# Patient Record
Sex: Female | Born: 1980 | Race: Black or African American | Hispanic: No | Marital: Married | Smoking: Former smoker
Health system: Southern US, Community
[De-identification: ages and names within clinical notes are randomized; demographics above are authoritative.]

## PROBLEM LIST (undated history)

## (undated) DIAGNOSIS — R87629 Unspecified abnormal cytological findings in specimens from vagina: Secondary | ICD-10-CM

## (undated) DIAGNOSIS — D649 Anemia, unspecified: Secondary | ICD-10-CM

## (undated) DIAGNOSIS — B9689 Other specified bacterial agents as the cause of diseases classified elsewhere: Secondary | ICD-10-CM

## (undated) DIAGNOSIS — A599 Trichomoniasis, unspecified: Secondary | ICD-10-CM

## (undated) DIAGNOSIS — N760 Acute vaginitis: Secondary | ICD-10-CM

## (undated) DIAGNOSIS — IMO0002 Reserved for concepts with insufficient information to code with codable children: Secondary | ICD-10-CM

## (undated) HISTORY — PX: LEEP: SHX91

## (undated) HISTORY — PX: WISDOM TOOTH EXTRACTION: SHX21

---
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
## 2003-12-17 ENCOUNTER — Other Ambulatory Visit: Admission: RE | Admit: 2003-12-17 | Discharge: 2003-12-17 | Payer: Self-pay | Admitting: Obstetrics and Gynecology

## 2004-03-27 ENCOUNTER — Inpatient Hospital Stay (HOSPITAL_COMMUNITY): Admission: AD | Admit: 2004-03-27 | Discharge: 2004-04-07 | Payer: Self-pay | Admitting: *Deleted

## 2004-04-10 ENCOUNTER — Ambulatory Visit (HOSPITAL_COMMUNITY): Admission: RE | Admit: 2004-04-10 | Discharge: 2004-04-10 | Payer: Self-pay | Admitting: Obstetrics and Gynecology

## 2004-05-07 ENCOUNTER — Inpatient Hospital Stay (HOSPITAL_COMMUNITY): Admission: AD | Admit: 2004-05-07 | Discharge: 2004-05-10 | Payer: Self-pay | Admitting: Obstetrics and Gynecology

## 2004-05-12 ENCOUNTER — Inpatient Hospital Stay (HOSPITAL_COMMUNITY): Admission: AD | Admit: 2004-05-12 | Discharge: 2004-05-14 | Payer: Self-pay | Admitting: Obstetrics and Gynecology

## 2004-06-03 ENCOUNTER — Inpatient Hospital Stay (HOSPITAL_COMMUNITY): Admission: AD | Admit: 2004-06-03 | Discharge: 2004-06-03 | Payer: Self-pay | Admitting: Obstetrics and Gynecology

## 2004-06-26 ENCOUNTER — Encounter (INDEPENDENT_AMBULATORY_CARE_PROVIDER_SITE_OTHER): Payer: Self-pay | Admitting: *Deleted

## 2004-06-26 ENCOUNTER — Inpatient Hospital Stay (HOSPITAL_COMMUNITY): Admission: AD | Admit: 2004-06-26 | Discharge: 2004-06-28 | Payer: Self-pay | Admitting: Obstetrics and Gynecology

## 2004-07-02 ENCOUNTER — Ambulatory Visit: Admission: RE | Admit: 2004-07-02 | Discharge: 2004-07-02 | Payer: Self-pay | Admitting: Obstetrics and Gynecology

## 2006-12-06 DIAGNOSIS — IMO0002 Reserved for concepts with insufficient information to code with codable children: Secondary | ICD-10-CM

## 2006-12-06 DIAGNOSIS — R87619 Unspecified abnormal cytological findings in specimens from cervix uteri: Secondary | ICD-10-CM

## 2006-12-06 HISTORY — DX: Reserved for concepts with insufficient information to code with codable children: IMO0002

## 2006-12-06 HISTORY — DX: Unspecified abnormal cytological findings in specimens from cervix uteri: R87.619

## 2008-07-12 ENCOUNTER — Ambulatory Visit: Payer: Self-pay | Admitting: Gynecology

## 2008-07-17 ENCOUNTER — Ambulatory Visit: Payer: Self-pay | Admitting: Obstetrics & Gynecology

## 2008-08-02 ENCOUNTER — Ambulatory Visit: Payer: Self-pay | Admitting: Obstetrics & Gynecology

## 2009-01-07 ENCOUNTER — Emergency Department (HOSPITAL_COMMUNITY): Admission: EM | Admit: 2009-01-07 | Discharge: 2009-01-07 | Payer: Self-pay | Admitting: Emergency Medicine

## 2009-08-25 ENCOUNTER — Ambulatory Visit (HOSPITAL_COMMUNITY): Admission: RE | Admit: 2009-08-25 | Discharge: 2009-08-25 | Payer: Self-pay | Admitting: Family Medicine

## 2009-09-23 ENCOUNTER — Emergency Department (HOSPITAL_COMMUNITY): Admission: EM | Admit: 2009-09-23 | Discharge: 2009-09-23 | Payer: Self-pay | Admitting: Emergency Medicine

## 2009-10-08 ENCOUNTER — Ambulatory Visit: Payer: Self-pay | Admitting: Obstetrics and Gynecology

## 2009-10-09 ENCOUNTER — Encounter: Payer: Self-pay | Admitting: Obstetrics and Gynecology

## 2009-10-09 LAB — CONVERTED CEMR LAB
Clue Cells Wet Prep HPF POC: NONE SEEN
Trich, Wet Prep: NONE SEEN
WBC, Wet Prep HPF POC: NONE SEEN
Yeast Wet Prep HPF POC: NONE SEEN

## 2009-11-12 ENCOUNTER — Emergency Department (HOSPITAL_COMMUNITY): Admission: EM | Admit: 2009-11-12 | Discharge: 2009-11-12 | Payer: Self-pay | Admitting: Family Medicine

## 2009-11-25 ENCOUNTER — Emergency Department (HOSPITAL_COMMUNITY): Admission: EM | Admit: 2009-11-25 | Discharge: 2009-11-25 | Payer: Self-pay | Admitting: Family Medicine

## 2010-02-02 ENCOUNTER — Emergency Department (HOSPITAL_COMMUNITY): Admission: EM | Admit: 2010-02-02 | Discharge: 2010-02-02 | Payer: Self-pay | Admitting: Family Medicine

## 2011-02-24 LAB — WET PREP, GENITAL
Trich, Wet Prep: NONE SEEN
Yeast Wet Prep HPF POC: NONE SEEN

## 2011-02-24 LAB — GC/CHLAMYDIA PROBE AMP, GENITAL: GC Probe Amp, Genital: NEGATIVE

## 2011-02-24 LAB — POCT URINALYSIS DIP (DEVICE)
Hgb urine dipstick: NEGATIVE
Ketones, ur: NEGATIVE mg/dL
Protein, ur: 30 mg/dL — AB
Specific Gravity, Urine: 1.015 (ref 1.005–1.030)

## 2011-02-24 LAB — POCT PREGNANCY, URINE: Preg Test, Ur: NEGATIVE

## 2011-02-27 ENCOUNTER — Inpatient Hospital Stay (HOSPITAL_COMMUNITY)
Admission: AD | Admit: 2011-02-27 | Discharge: 2011-02-27 | Disposition: A | Discharge status: Home / Self Care | Payer: Self-pay | Source: Ambulatory Visit | Attending: Obstetrics & Gynecology | Admitting: Obstetrics & Gynecology

## 2011-02-27 ENCOUNTER — Inpatient Hospital Stay (HOSPITAL_COMMUNITY): Payer: Self-pay

## 2011-02-27 DIAGNOSIS — R109 Unspecified abdominal pain: Secondary | ICD-10-CM | POA: Insufficient documentation

## 2011-02-27 LAB — URINALYSIS, ROUTINE W REFLEX MICROSCOPIC
Glucose, UA: NEGATIVE mg/dL
Ketones, ur: NEGATIVE mg/dL
Protein, ur: NEGATIVE mg/dL
Urobilinogen, UA: 0.2 mg/dL (ref 0.0–1.0)

## 2011-02-27 LAB — WET PREP, GENITAL
Trich, Wet Prep: NONE SEEN
Yeast Wet Prep HPF POC: NONE SEEN

## 2011-02-27 LAB — POCT PREGNANCY, URINE: Preg Test, Ur: NEGATIVE

## 2011-02-27 LAB — CBC
HCT: 36.9 % (ref 36.0–46.0)
Platelets: 188 10*3/uL (ref 150–400)
RBC: 5.17 MIL/uL — ABNORMAL HIGH (ref 3.87–5.11)
RDW: 14.7 % (ref 11.5–15.5)
WBC: 5 10*3/uL (ref 4.0–10.5)

## 2011-03-02 LAB — GC/CHLAMYDIA PROBE AMP, GENITAL: Chlamydia, DNA Probe: NEGATIVE

## 2011-03-08 LAB — POCT RAPID STREP A (OFFICE): Streptococcus, Group A Screen (Direct): POSITIVE — AB

## 2011-03-15 ENCOUNTER — Inpatient Hospital Stay (INDEPENDENT_AMBULATORY_CARE_PROVIDER_SITE_OTHER)
Admission: RE | Admit: 2011-03-15 | Discharge: 2011-03-15 | Disposition: A | Discharge status: Home / Self Care | Payer: Self-pay | Source: Ambulatory Visit | Attending: Family Medicine | Admitting: Family Medicine

## 2011-03-15 DIAGNOSIS — R6889 Other general symptoms and signs: Secondary | ICD-10-CM

## 2011-03-15 LAB — WET PREP, GENITAL
Clue Cells Wet Prep HPF POC: NONE SEEN
Trich, Wet Prep: NONE SEEN
Yeast Wet Prep HPF POC: NONE SEEN

## 2011-03-16 LAB — GC/CHLAMYDIA PROBE AMP, GENITAL: GC Probe Amp, Genital: NEGATIVE

## 2011-03-16 LAB — POCT URINALYSIS DIP (DEVICE)
Bilirubin Urine: NEGATIVE
Glucose, UA: NEGATIVE mg/dL
Hgb urine dipstick: NEGATIVE
Ketones, ur: NEGATIVE mg/dL
Specific Gravity, Urine: >=1.03 (ref 1.005–1.030)

## 2011-04-20 NOTE — Group Therapy Note (Signed)
NAMEAVARI, NEVARES NO.:  [id number]   MEDICAL RECORD NO.:  [id number]          PATIENT TYPE:  WOC   LOCATION:  [facility]                   FACILITY:  [facility]   PHYSICIAN:  Elsie Lincoln, MD      DATE OF BIRTH:  [birth year]   DATE OF SERVICE:                                  CLINIC NOTE   HISTORY:  The patient is a 30 year old female who presents for LEEP.  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  She has been consented for LEEP.  The patient understands the risks include, but are not limited to  bleeding, infection and incompetent cervix.  The patient finally removed  all of her piercings.  Her UPT is negative.  She does have IUD strings  present.   HOSPITAL COURSE:  The patient is placed in dorsal lithotomy position and  an insulated speculum was placed into the vagina.  The cervix was  brought into view, 10 cc of 2% lidocaine with 200,000 epinephrine was  injected in 4 quadrants.  The patient started feeling slightly  lightheaded.  Her pulse was 96 and she was not diaphoretic.  She agreed  to continue with the procedure given that her vital signs were stable.  The LEEP was performed using a Fischer medium-size electrode.  The edges  of the excision was roller balled with good hemostasis.  Monsel's was  placed in the cervix.  Post-procedure, the patient still felt  lightheaded and with blurry vision.  Her pulse is now 72.  However, she  does admit to not eating all day and it is now 4:15.  We are obtaining  crackers, peanut butter and juice.  We will check a CBG for  hypoglycemia.   FOLLOW UP:  The patient is to come back in 2 weeks for results.           ______________________________  Elsie Lincoln, MD     KL/MEDQ  D:  07/17/2008  T:  07/17/2008  Job:  [id number]

## 2011-04-20 NOTE — Group Therapy Note (Signed)
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  The  patient's Pap smear preceding said colposcopy demonstrated ASCUS with  positive HPV findings.  The patient has never had a prior history of Pap  abnormalities or treatments of her cervix.   The patient was advised about the benefits of a LEEP procedure and will  be scheduled for same.  She will see the video before she leaves the  office today and explanation as to expectations.  Post LEEP were  discussed with the patient.           ______________________________  Ginger Carne, MD     SHB/MEDQ  D:  07/12/2008  T:  07/12/2008  Job:  [id number]

## 2011-04-20 NOTE — Group Therapy Note (Signed)
NAMESHALIAH, WANN NO.:  [id number]   MEDICAL RECORD NO.:  [id number]          PATIENT TYPE:  WOC   [facility]ATION[facility]                   [facility]   PHYSICIAN:  Elsie Lincoln, MD      DATE OF BIRTH:  [birth year]   DATE OF SERVICE:  08/02/2008                                  CLINIC NOTE   The patient is a 30 year old female who presents for a LEEP result.  She  has CIN II-III on biopsy and the LEEP states that the pathology is  consistent with this.  Margins were negative.  There is also a benign  nabothian cyst.  The patient was complaining of some discharge on  sterile speculum exam.  There was no discharge noted and the cervix  appears well-healed.  The patient is not sexually active currently, but  does have an IUD in place.  I did not see the strings on our exam.  At  her next Pap smear we will look again, because during my dictation on  July 17, 2008, there were presence of strings.  The patient will  return in 4 months for Pap smear.           ______________________________  Elsie Lincoln, MD     KL/MEDQ  D:  08/02/2008  T:  08/02/2008  Job:  [id number]

## 2011-04-23 NOTE — H&P (Signed)
NAME:  Stacy [name], Stacy [name]                        ACCOUNT NO.:  [id number]   MEDICAL RECORD NO.:  [id number]                   PATIENT TYPE:  INP   LOCATION:  9154                                 [facility]   PHYSICIAN:  Maxie Better, M.D.            DATE OF BIRTH:  04-08-1981   DATE OF ADMISSION:  03/27/2004  DATE OF DISCHARGE:                                HISTORY & PHYSICAL   CHIEF COMPLAINT:  Oligohydramnios.   HISTORY OF PRESENT ILLNESS:  This is a 30 year old, gravida 1, para 0,  married, black female with an LMP of October 02, 2003, [facility] of July 09, 2004, which is consistent with ultrasound done on December 30, 2003, at 12  weeks and 2 days, who is now at 25 weeks and 2 days gestation.  Being  admitted for aggressive IV hydration secondary to oligohydramnios.  The  patient underwent an ultrasound today on March 27, 2004, that showed an  amniotic fluid index of 7.4, which is at the first percentile.  Her  biophysical profile was 8/8.  Dopplers are normal.  Her prenatal course has  been notable for a diagnosis of alpha thalassemia trait.  Her husband also  has alpha thalassemia trait.  She was counseled at the [facility].  In addition, the pregnancy has been complicated by a question of  intrauterine growth restriction by ultrasound done on March 18, 2004, at  which time the estimated fetal weight was 536 g, which was at the 6th  percentile.  Amniotic fluid was normal at that time.  The patient had a  study done to follow up an ultrasound done on February 19, 2004, at which time  she presented for anatomic fetal survey and was found to have an estimated  fetal weight of about 12th percentile.  The anatomic fetal survey was  otherwise unremarkable.  The patient had a consultative opinion with Dr.  Sherrie George at [facility] regarding the intrauterine growth  restriction diagnosis.  The patient was seen on March 20, 2004, at which  time she was felt to be 557  g, which was at the 24th percentile.  Recommendation was made at that time for repeat ultrasound in three weeks'  time to follow growth in that this may be a constitutionally small fetal or  fetal growth restriction evolving.  The patient had a first trimester  genetic screen that was normal.  AFP 3 test was normal.  She is a nonsmoker.  She has noted good fetal movements.  No vaginal bleeding.  She has had poor  weight gain to date, a total of 10 pounds.  She has no hypertension or  diabetes.  The patient denied any leakage of fluid or abnormal discharge.  Prenatal care was at [facility].  Primary obstetrician Maxie Better, M.D.   PRENATAL LABORATORIES:  Her blood type is O positive.  Antibody screen is  negative.  Hemoglobin electrophoresis consistent with alpha thalassemia  trait.  RPR was nonreactive.  Rubella was immune.  Hepatitis B surface  antigen was negative.  HIV test was negative.  GC and chlamydia were  negative.  Pap was within normal limits.  Ultrasound per the HPI.  The  patient received betamethasone on March 18, 2004, and March 19, 2004.   ALLERGIES:  No known drug allergies.   MEDICINES:  Prenatal vitamins.   PAST MEDICAL HISTORY:  Negative.   PAST SURGICAL HISTORY:  Negative.   FAMILY HISTORY:  Noncontributory.   SOCIAL HISTORY:  Married.  Nonsmoker at home.   REVIEW OF SYSTEMS:  Negative.   PHYSICAL EXAMINATION:  GENERAL APPEARANCE:  A petite, gravid, black female  in no acute distress.  VITAL SIGNS:  Blood pressure 100/60.  The fetal heart rate was 144.  WEIGHT:  128.6 pounds.  SKIN:  No lesions.  HEENT:  Anicteric sclerae.  Pink conjunctivae.  The oropharynx was negative.  HEART:  Regular rate and rhythm without murmur.  LUNGS:  Clear to auscultation.  BREASTS:  Soft and nontender.  No palpable mass.  ABDOMEN:  Gravid.  Fundal height of 24 cm.  PELVIC:  Deferred.  EXTREMITIES:  No edema.   IMPRESSION:  1. Oligohydramnios.  2.  Intrauterine gestation of 25-2/7 weeks.   PLAN:  1. Admission.  2. Aggressive IV hydration.  3. Repeat amniotic fluid index on Sunday.  4. Continuous fetal monitor to the best of the ability of that gestational     age.  5. Routine admission laboratories if indicated.                                               Maxie Better, M.D.    [place]/MEDQ  D:  03/27/2004  T:  03/27/2004  Job:  [id number]

## 2011-04-23 NOTE — H&P (Signed)
NAMERABAB, CURRINGTON                        ACCOUNT NO.:  [id number]   MEDICAL RECORD NO.:  [id number]                   PATIENT TYPE:  INP   LOCATION:  9176                                 [facility]   PHYSICIAN:  Maxie Better, M.D.            DATE OF BIRTH:  [birth year]   DATE OF ADMISSION:  05/07/2004  DATE OF DISCHARGE:                                HISTORY & PHYSICAL   CHIEF COMPLAINT:  Oligohydramnios.   HISTORY OF PRESENT ILLNESS:  This is a second admission for this 30-year-  old, gravida 1, para 0, married, black female, last menstrual period of  October 01, 2004, [facility] of July 09, 2004 who is currently at 79 1/[redacted] weeks  gestation being admitted for aggressive hydration secondary to  oligohydramnios. The patient underwent ultrasound on May 07, 2004 where an  amniotic fluid index was 8.0 which was less than the 5th percentile.  Estimated fetal weight was 3 pounds 5 ounces which is of the 11th  percentile; however, the abdominal circumference of the fetus was at the 4th  percentile, the BPD was 8.0 which was in the 71st percentile. Dopplers were  normal. The patient notes good fetal movement, she denies any leakage of  fluid or vaginal bleeding.  Her fern study was negative in the office. The  patient was previously admitted to [facility] on March 27, 2004  through Apr 07, 2004 for oligohydramnios as well.  With resolution of her  oligohydramnios at that time as well as the intrauterine growth restriction  which had been previously diagnosed.  Prenatal care is at [facility],  primary obstetrician Maxie Better, M.D.   PRENATAL LABS:  Blood type O positive, antibody screen is negative.  Hemoglobin electrophoresis is positive for alpha thalassemia trait. Father  of the baby is also apha thalassemia trait.  RPR is nonreactive, rubella is  immune.  Hepatitis B surface antigen is negative, HIV test was negative.  GC  and chlamydia cultures were negative.   Pap was within normal limits.  First  trimester genetic screening was normal, glucose challenge test was normal.  The patient had an ultrasound on Apr 14, 2004 at which time the amniotic  fluid index was 11.9, which was at the 21st percentile.  Biophysical profile  was 8/8.  The baby weighed 1047 gram at that time which was at the 18th  percentile.   PAST MEDICAL HISTORY:  No known drug allergies.   MEDICATIONS:  Prenatal vitamins, iron.   PAST MEDICAL HISTORY:  Alpha thalassemia trait.   PAST SURGICAL HISTORY:  Negative.   OBSTETRICAL HISTORY:  Present pregnancy.   FAMILY HISTORY:  Noncontributory.   SOCIAL HISTORY:  Married, nonsmoker at home.   REVIEW OF SYMPTOMS:  Negative except as the history of present illness.   PHYSICAL EXAMINATION:  GENERAL:  Well-developed, well-nourished, black  female, gravid in no acute distress.  VITAL SIGNS:  Blood pressure 84/52, weight  135 pounds, fetal heart rate was  137.  SKIN:  Shows no lesions.  HEENT:  Anicteric sclera.  Pail, pink conjunctiva.  Oropharynx negative.  HEART:  Regular rate and rhythm without murmur.  LUNGS:  Clear to auscultation.  BREASTS:  Soft, nontender without palpable mass.  BACK:  No CVA tenderness.  ABDOMEN:  Gravida, fundal height of 28 cm.  PELVIC:  Vulva showed no lesions, vagina has a white discharge.  __________  was negative. Crist Fat was negative. Cervix was long and closed, presenting part  was vertex, high.  EXTREMITIES:  No edema.   IMPRESSION:  Recurrent oligohydramnios, intrauterine gestation at 31-1/7th  weeks, early IAGI/STA alpha-thalassemia trait.   PLAN:  Admission, continuous fetal monitoring, aggressive IV fluid  hydration, repeat amniotic fluid index on Sunday and increase oral fluid  intake.                                               Maxie Better, M.D.    [place]/MEDQ  D:  05/07/2004  T:  05/07/2004  Job:  [id number]

## 2011-04-23 NOTE — H&P (Signed)
NAMEDEERICA, WASZAK                        ACCOUNT NO.:  [id number]   MEDICAL RECORD NO.:  [id number]                   PATIENT TYPE:  INP   LOCATION:  9160                                 [facility]   PHYSICIAN:  Richardean Sale, M.D.                DATE OF BIRTH:  Feb 13, 1981   DATE OF ADMISSION:  05/12/2004  DATE OF DISCHARGE:                                HISTORY & PHYSICAL   CHIEF COMPLAINT:  Oligohydramnios.   HISTORY OF PRESENT ILLNESS:  This is a 30 year old gravida 1 para 0 married  black female with a last menstrual period of October 02, 2003 and an [facility] of  July 09, 2004 who is now 72 and 6 days gestation.  The patient has been  admitted multiple times for IV hydration secondary to oligohydramnios.  The  patient was recently discharged from the hospital on May 10, 2004 after a 2-  day hospital stay for IV fluids.  Amniotic fluid index at that time was  normal.  She presented to the office today for follow-up ultrasound and the  amniotic fluid index is now only 8 which is less than the 5th percentile for  her gestational age.  Heart rate is 136.  Biophysical profile performed is  8/8 and umbilical artery Dopplers are normal.  The patient has been placed  on home bedrest but has been admitted multiple times for oligohydramnios.  On recent ultrasound for estimated fetal weight at 31 weeks weight was only  1503 g which is the 11th percentile for gestational age and there is  questionable asymmetric IUGR given lag of the abdominal circumference.  The  patient has had a normal first trimester screen and normal AFP test.  She is  a nonsmoker.  She has had poor weight gain throughout the pregnancy.  There  is no history of hypertension or diabetes.  Today the patient is without  complaint.  She denies any loss of fluid, vaginal bleedings, or  contractions, and reports good fetal movement.  Prenatal care is at [facility] with primary obstetrician Maxie Better,  M.D.   PRENATAL LABORATORY DATA:  Blood type is O positive, antibody screen  negative.  Hemoglobin electrophoresis consistent with alpha thalassemia  trait.  RPR nonreactive.  Rubella immune.  Hepatitis B surface antigen  negative.  HIV negative.  Gonorrhea and chlamydia negative.  Pap within  normal limits.  Recent ultrasound as above.  One-hour Glucola 105.  The  patient is status post betamethasone on April 13 and March 19, 2004.   ALLERGIES:  No known drug allergies.   MEDICATIONS:  Prenatal vitamins.   PAST MEDICAL HISTORY:  No prior hospitalizations with the exception of  during this pregnancy.   PAST SURGICAL HISTORY:  None.   SOCIAL HISTORY:  Married.  No tobacco, alcohol, or drugs.   OBSTETRICAL HISTORY:  Gravida 1 para 0.   GYNECOLOGICAL HISTORY:  Positive history  of chlamydia.  No abnormal Pap  smears.   FAMILY HISTORY:  Noncontributory.   PHYSICAL EXAMINATION:  GENERAL:  She is a thin, gravid black female in no  apparent distress.  VITAL SIGNS:  Blood pressure 104/58, weight 132, fetal heart tones 136 on  ultrasound with BPP of 8/8.  HEENT:  Within normal limits.  HEART:  Regular rate and rhythm.  LUNGS:  Clear.  ABDOMEN:  Gravid, soft, nontender.  PELVIC:  Deferred.  EXTREMITIES:  No edema.   ASSESSMENT:  Intrauterine pregnancy at 31 weeks 6 days gestation with  persistent oligohydramnios.   PLAN:  1. Will admit to antenatal.  2. Start IV fluid therapy.  3. Recommend repeat amniotic fluid index in 2 days.  4. Continuous fetal monitoring.                                               Richardean Sale, M.D.    JW/MEDQ  D:  05/12/2004  T:  05/12/2004  Job:  [id number]

## 2011-04-23 NOTE — Discharge Summary (Signed)
Stacy [name], Stacy [name]                        ACCOUNT NO.:  [id number]   MEDICAL RECORD NO.:  [id number]                   PATIENT TYPE:  INP   LOCATION:  9159                                 [facility]   PHYSICIAN:  Maxie Better, M.D.            DATE OF BIRTH:  Apr 01, 1981   DATE OF ADMISSION:  05/07/2004  DATE OF DISCHARGE:  05/10/2004                                 DISCHARGE SUMMARY   ADMISSION DIAGNOSES:  1. Recurrent oligohydramnios.  2. Intrauterine gestation at 47 and one-seventh weeks.  3. Early intrauterine growth restriction/small for gestational age.  4. Alpha thalassemia trait.   DISCHARGE DIAGNOSES:  1. Recurrent oligohydramnios, resolved.  2. Intrauterine gestation at 108 and four-sevenths weeks.  3. Alpha thalassemia trait.  4. Early intrauterine growth restriction/small-for-gestational- age baby.   HISTORY OF PRESENT ILLNESS:  This 30 year old gravida 1 para 0 female at 55  and one-seventh weeks gestation admitted for aggressive IV hydration  secondary to recurrence of her oligohydramnios.  The patient had been  previously hospitalized for a similar problem with subsequent resolution of  her oligohydramnios.  The patient had an ultrasound on May 07, 2004 that  showed her amniotic fluid index to be less than the fifth percentile.  Estimated fetal weight was 8 pounds 5 ounces which was at the 11th  percentile with the abdominal circumference at the fourth percentile.   HOSPITAL COURSE:  The patient was admitted to [facility].  IV  hydration was started.  She was placed on continuous monitoring.  She had a  reactive nonstress test throughout her stay.  On May 10, 2004 the patient  had a repeat amniotic fluid index performed which was 10.1 which was in the  normal range.  Nonstress test remained reactive.  She was deemed well to be  discharged home.   DISPOSITION:  Home.   CONDITION:  Stable.   DISCHARGE FOLLOW-UP:  In the office on May 12, 2004  for nonstress test.   DISCHARGE INSTRUCTIONS:  Call for leakage of fluid, vaginal bleeding,  decreased fetal movement, contractions six or more per hour.  The patient  was also instructed to increase her oral intake of fluid and to do as much  bedrest as possible.                                               Maxie Better, M.D.    [place]/MEDQ  D:  05/23/2004  T:  05/25/2004  Job:  [id number]

## 2011-04-23 NOTE — Discharge Summary (Signed)
[name], [name]                        ACCOUNT NO.:  [id number]   MEDICAL RECORD NO.:  [id number]                   PATIENT TYPE:  INP   LOCATION:  9154                                 [facility]   PHYSICIAN:  Maxie Better, M.D.            DATE OF BIRTH:  03-25-1981   DATE OF ADMISSION:  03/27/2004  DATE OF DISCHARGE:  04/07/2004                                 DISCHARGE SUMMARY   ADMISSION DIAGNOSES:  1. Oligohydramnios.  2. Intrauterine gestation at 72 and 2/7ths weeks.  3. Intrauterine growth restriction.  4. Alpha thalassemia trait.   DISCHARGE DIAGNOSES:  1. Oligohydramnios, resolved.  2. Intrauterine gestation at 19 and 5/7ths weeks.  3. Small for gestational age fetus.  4. Alpha thalassemia trait.   HISTORY OF PRESENT ILLNESS:  A 30 year old, gravida 1, para 0 female at 25  and 2/7ths weeks gestation admitted for aggressive IV hydration secondary to  oligohydramnios.   HOSPITAL COURSE:  The patient was admitted to [facility].  IV hydration  was started.  The patient underwent serial ultrasounds to follow amniotic  fluid index.  When her amniotic fluid had risen to 10.2, which __________  improved, the patient was discharged home.  The patient had antepartum  testing with continuous and intermittent fetal monitoring.  By hospital day  #12, the patient had resolution of her oligohydramnios, and she was deemed  well to be discharged home.   DISPOSITION:  Home.   CONDITION:  Stable.   DISCHARGE MEDICATIONS:  1. Prenatal vitamins one p.o. daily.  2. Iron supplementation on p.o. b.i.d.   FOLLOW UP APPOINTMENTS:  In 1 week at [facility].                                               Maxie Better, M.D.    [place]/MEDQ  D:  04/18/2004  T:  04/18/2004  Job:  [id number]

## 2011-09-03 ENCOUNTER — Inpatient Hospital Stay (HOSPITAL_COMMUNITY)
Admission: AD | Admit: 2011-09-03 | Discharge: 2011-09-03 | Disposition: A | Discharge status: Home / Self Care | Payer: Medicaid Other | Source: Ambulatory Visit | Attending: Obstetrics & Gynecology | Admitting: Obstetrics & Gynecology

## 2011-09-03 ENCOUNTER — Inpatient Hospital Stay (HOSPITAL_COMMUNITY): Payer: Medicaid Other

## 2011-09-03 ENCOUNTER — Encounter (HOSPITAL_COMMUNITY): Payer: Self-pay

## 2011-09-03 DIAGNOSIS — O209 Hemorrhage in early pregnancy, unspecified: Secondary | ICD-10-CM

## 2011-09-03 HISTORY — DX: Reserved for concepts with insufficient information to code with codable children: IMO0002

## 2011-09-03 LAB — WET PREP, GENITAL: Trich, Wet Prep: NONE SEEN

## 2011-09-03 LAB — CBC
HCT: 32.2 % — ABNORMAL LOW (ref 36.0–46.0)
MCHC: 31.1 g/dL (ref 30.0–36.0)
RDW: 15 % (ref 11.5–15.5)

## 2011-09-03 LAB — URINALYSIS, ROUTINE W REFLEX MICROSCOPIC
Bilirubin Urine: NEGATIVE
Glucose, UA: NEGATIVE mg/dL
Ketones, ur: NEGATIVE mg/dL
Leukocytes, UA: NEGATIVE
pH: 7 (ref 5.0–8.0)

## 2011-09-03 LAB — HCG, QUANTITATIVE, PREGNANCY: hCG, Beta Chain, Quant, S: 16046 m[IU]/mL — ABNORMAL HIGH (ref <5)

## 2011-09-03 NOTE — ED Provider Notes (Signed)
History     Chief Complaint  Patient presents with  . Possible Pregnancy   HPI  Pt is here with report of missed LMP and brown/red discharge with odor that started yesterday.  Denies pelvic pain.   Past Medical History  Diagnosis Date  . Abnormal Pap smear 2008    LEEP, repeat WNL    Past Surgical History  Procedure Date  . Leep     History reviewed. No pertinent family history.  History  Substance Use Topics  . Smoking status: Never Smoker   . Smokeless tobacco: Not on file  . Alcohol Use: No    Allergies: No Known Allergies  Prescriptions prior to admission  Medication Sig Dispense Refill  . Ascorbic Acid (VITAMIN C) 100 MG tablet Take 100 mg by mouth daily.        . Naproxen Sodium (ALEVE PO) Take 2 tablets by mouth daily as needed. For headache.       . prenatal vitamin w/FE, FA (PRENATAL 1 + 1) 27-1 MG TABS Take 1 tablet by mouth daily.        . vitamin B-12 (CYANOCOBALAMIN) 100 MCG tablet Take 50 mcg by mouth daily.          Review of Systems  Genitourinary:       Brown/red vaginal discharge.   All other systems reviewed and are negative.   Physical Exam   Blood pressure 104/61, pulse 81, temperature 99.4 F (37.4 C), temperature source Oral, resp. rate 16, height 5\' 4"  (1.626 m), weight 55.067 kg (121 lb 6.4 oz), last menstrual period 07/20/2011, SpO2 100.00%.  Physical Exam  Constitutional: She is oriented to person, place, and time. She appears well-developed and well-nourished. No distress.  HENT:  Head: Normocephalic.  Neck: Normal range of motion. Neck supple.  Cardiovascular: Normal rate, regular rhythm and normal heart sounds.  Exam reveals no gallop and no friction rub.   No murmur heard. Respiratory: Effort normal and breath sounds normal. No respiratory distress.  GI: She exhibits no mass. There is no tenderness. There is no rebound, no guarding and no CVA tenderness.  Genitourinary: Uterus is enlarged. Cervix exhibits no motion tenderness  and no discharge. Vaginal discharge (white, creamy) found.  Musculoskeletal: Normal range of motion.  Neurological: She is alert and oriented to person, place, and time.  Skin: Skin is warm and dry.  Psychiatric: She has a normal mood and affect.    MAU Course  Procedures [place] GC/CT Wet Prep UA CBC ABO/RH  Assessment and Plan  Report given to Dr. Henrietta Hoover.  [facility] 09/03/2011, 7:12 PM   I assumed care of this pt.  Results for orders placed during the hospital encounter of 09/03/11 (from the past 24 hour(s))  URINALYSIS, ROUTINE W REFLEX MICROSCOPIC     Status: Normal   Collection Time   09/03/11  6:45 PM      Component Value Range   Color, Urine YELLOW  YELLOW    Appearance CLEAR  CLEAR    Specific Gravity, Urine 1.010  1.005 - 1.030    pH 7.0  5.0 - 8.0    Glucose, UA NEGATIVE  NEGATIVE (mg/dL)   Hgb urine dipstick NEGATIVE  NEGATIVE    Bilirubin Urine NEGATIVE  NEGATIVE    Ketones, ur NEGATIVE  NEGATIVE (mg/dL)   Protein, ur NEGATIVE  NEGATIVE (mg/dL)   Urobilinogen, UA 1.0  0.0 - 1.0 (mg/dL)   Nitrite NEGATIVE  NEGATIVE    Leukocytes, UA NEGATIVE  NEGATIVE  POCT PREGNANCY, URINE     Status: Normal   Collection Time   09/03/11  6:49 PM      Component Value Range   Preg Test, Ur POSITIVE    CBC     Status: Abnormal   Collection Time   09/03/11  7:15 PM      Component Value Range   WBC 6.1  4.0 - 10.5 (K/uL)   RBC 4.51  3.87 - 5.11 (MIL/uL)   Hemoglobin 10.0 (*) 12.0 - 15.0 (g/dL)   HCT 16.1 (*) 09.6 - 46.0 (%)   MCV 71.4 (*) 78.0 - 100.0 (fL)   MCH 22.2 (*) 26.0 - 34.0 (pg)   MCHC 31.1  30.0 - 36.0 (g/dL)   RDW 04.5  40.9 - 81.1 (%)   Platelets 195  150 - 400 (K/uL)  HCG, QUANTITATIVE, PREGNANCY     Status: Abnormal   Collection Time   09/03/11  7:19 PM      Component Value Range   hCG, Beta Chain, Quant, S 16046 (*) <5 (mIU/mL)  WET PREP, GENITAL     Status: Abnormal   Collection Time   09/03/11  7:22 PM      Component Value Range   Yeast,  Wet Prep NONE SEEN  NONE SEEN    Trich, Wet Prep NONE SEEN  NONE SEEN    Clue Cells, Wet Prep FEW (*) NONE SEEN    WBC, Wet Prep HPF POC FEW (*) NONE SEEN   ABO/RH     Status: Normal   Collection Time   09/03/11  7:22 PM      Component Value Range   ABO/RH(D) O POS     US Ob Comp Less 14 Wks  09/03/2011  *RADIOLOGY REPORT*  Clinical Data: Vaginal bleeding.  Positive pregnancy test.  6-week- 3-day gestational age by LMP.  OBSTETRIC <14 WK [place] AND TRANSVAGINAL OB US  Technique:  Both transabdominal and transvaginal ultrasound examinations were performed for complete evaluation of the gestation as well as the maternal uterus, adnexal regions, and pelvic cul-de-sac.  Transvaginal technique was performed to assess early pregnancy.  Comparison:  02/27/2011  Intrauterine gestational sac:  Visualized/normal in shape. Yolk sac: Visualized Embryo: Not visualized  MSD: 12   mm  5   w  6   d          [place] EDC: 04/29/2012  Maternal uterus/adnexae: A small subchorionic hemorrhage noted.  Both ovaries are normal in appearance.  No evidence of adnexal mass or free fluid.  IMPRESSION:  1.  Single intrauterine gestational sac measuring 6 weeks, which is concordant with LMP. 2.  Small subchronic hemorrhage noted.  Original Report Authenticated By: Danae Orleans, M.D.   US Ob Transvaginal  09/03/2011  *RADIOLOGY REPORT*  Clinical Data: Vaginal bleeding.  Positive pregnancy test.  6-week- 3-day gestational age by LMP.  OBSTETRIC <14 WK [place] AND TRANSVAGINAL OB US  Technique:  Both transabdominal and transvaginal ultrasound examinations were performed for complete evaluation of the gestation as well as the maternal uterus, adnexal regions, and pelvic cul-de-sac.  Transvaginal technique was performed to assess early pregnancy.  Comparison:  02/27/2011  Intrauterine gestational sac:  Visualized/normal in shape. Yolk sac: Visualized Embryo: Not visualized  MSD: 12   mm  5   w  6   d          [place] EDC: 04/29/2012  Maternal  uterus/adnexae: A small subchorionic hemorrhage noted.  Both ovaries are normal in appearance.  No evidence of adnexal mass or free fluid.  IMPRESSION:  1.  Single intrauterine gestational sac measuring 6 weeks, which is concordant with LMP. 2.  Small subchronic hemorrhage noted.  Original Report Authenticated By: Danae Orleans, M.D.    A/P: Subchorionic hemorrhage: discussed with pt at length. She has an IUP with yolk sac. She will begin prenatal care. Discussed diet, activity, risks, and precautions.  Clinton Gallant. Jeiden Daughtridge III, DrHSc, MPAS, PA-C   Henrietta Hoover, [place] 09/03/11 2027

## 2011-09-03 NOTE — Progress Notes (Signed)
Pt states she is late for her period and may be pregnant. Started having a brown/red discharge with a slight odor yesterday.

## 2011-09-04 LAB — GC/CHLAMYDIA PROBE AMP, GENITAL: Chlamydia, DNA Probe: NEGATIVE

## 2011-10-05 ENCOUNTER — Inpatient Hospital Stay (HOSPITAL_COMMUNITY): Payer: Self-pay

## 2011-10-05 ENCOUNTER — Inpatient Hospital Stay (HOSPITAL_COMMUNITY)
Admission: AD | Admit: 2011-10-05 | Discharge: 2011-10-05 | Disposition: A | Discharge status: Home / Self Care | Payer: Medicaid Other | Source: Ambulatory Visit | Attending: Obstetrics & Gynecology | Admitting: Obstetrics & Gynecology

## 2011-10-05 ENCOUNTER — Encounter (HOSPITAL_COMMUNITY): Payer: Self-pay | Admitting: *Deleted

## 2011-10-05 DIAGNOSIS — O039 Complete or unspecified spontaneous abortion without complication: Secondary | ICD-10-CM | POA: Insufficient documentation

## 2011-10-05 LAB — CBC
MCH: 22.5 pg — ABNORMAL LOW (ref 26.0–34.0)
MCHC: 31 g/dL (ref 30.0–36.0)
MCV: 72.6 fL — ABNORMAL LOW (ref 78.0–100.0)
Platelets: 247 10*3/uL (ref 150–400)
RBC: 5.29 MIL/uL — ABNORMAL HIGH (ref 3.87–5.11)

## 2011-10-05 MED ORDER — HYDROCODONE-ACETAMINOPHEN 5-500 MG PO TABS: 1.0000 | ORAL_TABLET | Freq: Four times a day (QID) | ORAL | Status: AC | PRN

## 2011-10-05 MED ORDER — MISOPROSTOL 200 MCG PO TABS: ORAL_TABLET | ORAL | Status: DC

## 2011-10-05 MED ORDER — PROMETHAZINE HCL 25 MG PO TABS: 25.0000 mg | ORAL_TABLET | Freq: Four times a day (QID) | ORAL | Status: AC | PRN

## 2011-10-05 NOTE — ED Notes (Signed)
PA at bedside.  Discussing labs and [place].  Options discussed.

## 2011-10-05 NOTE — ED Notes (Signed)
calll to chaplain/for comfort call.  Pt not wanting to talk with anyone at this time, but I felt a follow up phone call may be beneficial.

## 2011-10-05 NOTE — ED Notes (Signed)
Possible miscarriage/failed preg.  Explained to pt and FOB.  Pt tearful.

## 2011-10-05 NOTE — Progress Notes (Signed)
Was at work, went to bathroom, noted blood in panties and when wiped.  No pain. Has been seen here, had [place].

## 2011-10-05 NOTE — ED Provider Notes (Signed)
History   Pt presents today c/o vag bleeding that began last pm. She states she began spotting last pm but denies any pain. She states her last episode of intercourse was 2 days ago. She denies fever or any other sx at this time.  Chief Complaint  Patient presents with  . Vaginal Bleeding   HPI  OB History    Grav Para Term Preterm Abortions TAB SAB Ect Mult Living   2 1 1  0  0 0 0 0 1      Past Medical History  Diagnosis Date  . Abnormal Pap smear 2008    LEEP, repeat WNL    Past Surgical History  Procedure Date  . Leep     No family history on file.  History  Substance Use Topics  . Smoking status: Never Smoker   . Smokeless tobacco: Former Neurosurgeon  . Alcohol Use: No    Allergies: No Known Allergies  Prescriptions prior to admission  Medication Sig Dispense Refill  . Ascorbic Acid (VITAMIN C) 100 MG tablet Take 100 mg by mouth daily.        . prenatal vitamin w/FE, FA (PRENATAL 1 + 1) 27-1 MG TABS Take 1 tablet by mouth daily.        . vitamin B-12 (CYANOCOBALAMIN) 100 MCG tablet Take 50 mcg by mouth daily.          Review of Systems  Constitutional: Negative for fever.  Cardiovascular: Negative for chest pain.  Gastrointestinal: Negative for nausea, vomiting, abdominal pain, diarrhea and constipation.  Genitourinary: Negative for dysuria, urgency, frequency and hematuria.  Neurological: Negative for dizziness and headaches.  Psychiatric/Behavioral: Negative for depression and suicidal ideas.   Physical Exam   Blood pressure 106/66, pulse 78, temperature 97.8 F (36.6 C), temperature source Oral, resp. rate 20, height 5\' 4"  (1.626 m), weight 124 lb 12.8 oz (56.609 kg), last menstrual period 07/20/2011.  Physical Exam  Nursing note and vitals reviewed. Constitutional: She is oriented to person, place, and time. She appears well-developed and well-nourished. No distress.  HENT:  Head: Normocephalic and atraumatic.  Eyes: EOM are normal. Pupils are equal,  round, and reactive to light.  GI: Soft. She exhibits no distension. There is no tenderness. There is no rebound and no guarding.  Genitourinary: There is bleeding around the vagina. Vaginal discharge found.       Cervix Lg/closed. Moderate amount of bleeding noted on exam. Uterus 6-8wks size and nontender. No adnexal masses or tenderness noted.  Neurological: She is alert and oriented to person, place, and time.  Skin: Skin is warm and dry. She is not diaphoretic.  Psychiatric: She has a normal mood and affect. Her behavior is normal. Judgment and thought content normal.    MAU Course  Procedures  Results for orders placed during the hospital encounter of 10/05/11 (from the past 24 hour(s))  CBC     Status: Abnormal   Collection Time   10/05/11  1:30 PM      Component Value Range   WBC 7.4  4.0 - 10.5 (K/uL)   RBC 5.29 (*) 3.87 - 5.11 (MIL/uL)   Hemoglobin 11.9 (*) 12.0 - 15.0 (g/dL)   HCT 16.1  09.6 - 04.5 (%)   MCV 72.6 (*) 78.0 - 100.0 (fL)   MCH 22.5 (*) 26.0 - 34.0 (pg)   MCHC 31.0  30.0 - 36.0 (g/dL)   RDW 40.9  81.1 - 91.4 (%)   Platelets 247  150 - 400 (K/uL)  [place] no longer shows a yolk sac or gestational sac. Now only with complex collection of intrauterine fluid. Consistent with SAB.  Assessment and Plan  SAB: discussed with pt at length. Discussed expectant management vs cytotec vs D&E. At this time pt desires cytotec. She is aware of potential for heavy bleeding and pain. Will also give Rx for lortab and phenergan. She will return in 2wks for f/u or prn. Discussed diet, activity, risks, and precautions.  Clinton Gallant. Micalah Cabezas III, DrHSc, MPAS, PA-C  10/05/2011, 12:37 PM   Henrietta Hoover, PA 10/05/11 1355

## 2011-10-05 NOTE — Progress Notes (Signed)
Measures [redacted]w[redacted]d on 09/28,  Had IUGS/ yolk sac.  Small subchorionic hemorrhage noted at that time.

## 2011-10-05 NOTE — ED Notes (Signed)
Bedside sono, FOB present

## 2011-10-18 ENCOUNTER — Encounter (HOSPITAL_COMMUNITY): Payer: Self-pay

## 2011-10-18 ENCOUNTER — Inpatient Hospital Stay (HOSPITAL_COMMUNITY)
Admission: AD | Admit: 2011-10-18 | Discharge: 2011-10-18 | Disposition: A | Discharge status: Home / Self Care | Payer: Medicaid Other | Source: Ambulatory Visit | Attending: Obstetrics & Gynecology | Admitting: Obstetrics & Gynecology

## 2011-10-18 DIAGNOSIS — A499 Bacterial infection, unspecified: Secondary | ICD-10-CM

## 2011-10-18 DIAGNOSIS — N760 Acute vaginitis: Secondary | ICD-10-CM

## 2011-10-18 DIAGNOSIS — O035 Genital tract and pelvic infection following complete or unspecified spontaneous abortion: Secondary | ICD-10-CM | POA: Insufficient documentation

## 2011-10-18 DIAGNOSIS — B9689 Other specified bacterial agents as the cause of diseases classified elsewhere: Secondary | ICD-10-CM

## 2011-10-18 DIAGNOSIS — O039 Complete or unspecified spontaneous abortion without complication: Secondary | ICD-10-CM

## 2011-10-18 LAB — CBC
HCT: 39.4 % (ref 36.0–46.0)
MCHC: 29.9 g/dL — ABNORMAL LOW (ref 30.0–36.0)
Platelets: 257 10*3/uL (ref 150–400)
RDW: 14.6 % (ref 11.5–15.5)
WBC: 4.6 10*3/uL (ref 4.0–10.5)

## 2011-10-18 LAB — WET PREP, GENITAL: Trich, Wet Prep: NONE SEEN

## 2011-10-18 MED ORDER — METRONIDAZOLE 500 MG PO TABS: 500.0000 mg | ORAL_TABLET | Freq: Two times a day (BID) | ORAL | Status: AC

## 2011-10-18 MED ORDER — FLUCONAZOLE 150 MG PO TABS: 150.0000 mg | ORAL_TABLET | Freq: Once | ORAL | Status: AC

## 2011-10-18 NOTE — ED Provider Notes (Signed)
History   Pt presents today for f/u on cytotec for missed ab. Pt states she had good result and had heavy bleeding for a short time only. She states she no longer has any pain or bleeding. However, she now c/o vag dc with odor. She denies fever or any other sx at this time.  Chief Complaint  Patient presents with  . Follow-up   HPI  OB History    Grav Para Term Preterm Abortions TAB SAB Ect Mult Living   2 1 1  0  0 0 0 0 1      Past Medical History  Diagnosis Date  . Abnormal Pap smear 2008    LEEP, repeat WNL    Past Surgical History  Procedure Date  . Leep     No family history on file.  History  Substance Use Topics  . Smoking status: Never Smoker   . Smokeless tobacco: Former Neurosurgeon  . Alcohol Use: No    Allergies: No Known Allergies  Prescriptions prior to admission  Medication Sig Dispense Refill  . Ascorbic Acid (VITAMIN C) 100 MG tablet Take 100 mg by mouth daily.        . misoprostol (CYTOTEC) 200 MCG tablet Take all four tabs by mouth as a single dose.  4 tablet  0  . prenatal vitamin w/FE, FA (PRENATAL 1 + 1) 27-1 MG TABS Take 1 tablet by mouth daily.        . vitamin B-12 (CYANOCOBALAMIN) 100 MCG tablet Take 50 mcg by mouth daily.          Review of Systems  Constitutional: Negative for fever.  Cardiovascular: Negative for chest pain.  Gastrointestinal: Negative for nausea, vomiting, abdominal pain, diarrhea and constipation.  Genitourinary: Negative for dysuria, urgency, frequency and hematuria.  Neurological: Negative for dizziness and headaches.  Psychiatric/Behavioral: Negative for depression and suicidal ideas.   Physical Exam   Blood pressure 105/62, pulse 65, temperature 98.3 F (36.8 C), temperature source Oral, resp. rate 16, height 5\' 6"  (1.676 m), weight 119 lb 12.8 oz (54.341 kg), last menstrual period 07/20/2011, SpO2 99.00%, unknown if currently breastfeeding.  Physical Exam  Nursing note and vitals reviewed. Constitutional: She is  oriented to person, place, and time. She appears well-developed and well-nourished. No distress.  HENT:  Head: Normocephalic and atraumatic.  Eyes: EOM are normal. Pupils are equal, round, and reactive to light.  GI: Soft. She exhibits no distension. There is no tenderness. There is no rebound and no guarding.  Genitourinary: No bleeding around the vagina. Vaginal discharge found.       Uterus NL size and shape with no adnexal masses. Pt nontender on exam.  Neurological: She is alert and oriented to person, place, and time.  Skin: Skin is warm and dry. She is not diaphoretic.  Psychiatric: She has a normal mood and affect. Her behavior is normal. Judgment and thought content normal.    MAU Course  Procedures  Results for orders placed during the hospital encounter of 10/18/11 (from the past 24 hour(s))  CBC     Status: Abnormal   Collection Time   10/18/11  1:07 PM      Component Value Range   WBC 4.6  4.0 - 10.5 (K/uL)   RBC 5.36 (*) 3.87 - 5.11 (MIL/uL)   Hemoglobin 11.8 (*) 12.0 - 15.0 (g/dL)   HCT 16.1  09.6 - 04.5 (%)   MCV 73.5 (*) 78.0 - 100.0 (fL)   MCH 22.0 (*) 26.0 -  34.0 (pg)   MCHC 29.9 (*) 30.0 - 36.0 (g/dL)   RDW 16.1  09.6 - 04.5 (%)   Platelets 257  150 - 400 (K/uL)  WET PREP, GENITAL     Status: Abnormal   Collection Time   10/18/11  2:40 PM      Component Value Range   Yeast, Wet Prep NONE SEEN  NONE SEEN    Trich, Wet Prep NONE SEEN  NONE SEEN    Clue Cells, Wet Prep FEW (*) NONE SEEN    WBC, Wet Prep HPF POC FEW (*) NONE SEEN      Assessment and Plan  BV: discussed with pt at length. Will give Rx for flagyl. Warned of antabuse reaction. Will also give Rx for diflucan per pt request.   Complete AB: no further f/u needed. Discussed diet, activity, risks, and precautions.  Clinton Gallant. Saida Lonon III, DrHSc, MPAS, PA-C  10/18/2011, 2:45 PM   Henrietta Hoover, PA 10/18/11 1504

## 2011-10-18 NOTE — Progress Notes (Signed)
Patient states she had a SAB about 2 weeks ago and was given Cytotec. Had a lot of bleeding and pain after but now no pain or bleeding. Does have a yellowish discharge with an odor. Patient states she was instructed to come back to MAU for recheck.

## 2011-10-19 LAB — GC/CHLAMYDIA PROBE AMP, GENITAL: Chlamydia, DNA Probe: NEGATIVE

## 2011-10-21 NOTE — ED Provider Notes (Signed)
Agree with above note.  Bambie Pizzolato H. 10/21/2011 1:00 PM

## 2012-03-21 IMAGING — US US OB TRANSVAGINAL
1 series · 14 of 28 positions shown · non-contrast
Comparison: 02/27/2011

CLINICAL DATA: Vaginal bleeding.  Positive pregnancy test.  6-week-
3-day gestational age by LMP.

OBSTETRIC <14 WK US AND TRANSVAGINAL OB US
TECHNIQUE: Both transabdominal and transvaginal ultrasound
examinations were performed for complete evaluation of the
gestation as well as the maternal uterus, adnexal regions, and
pelvic cul-de-sac.  Transvaginal technique was performed to assess
early pregnancy.

[Series 1: us ob comp less 14 wks · 14 of 30 slices shown]
[im 2/30]
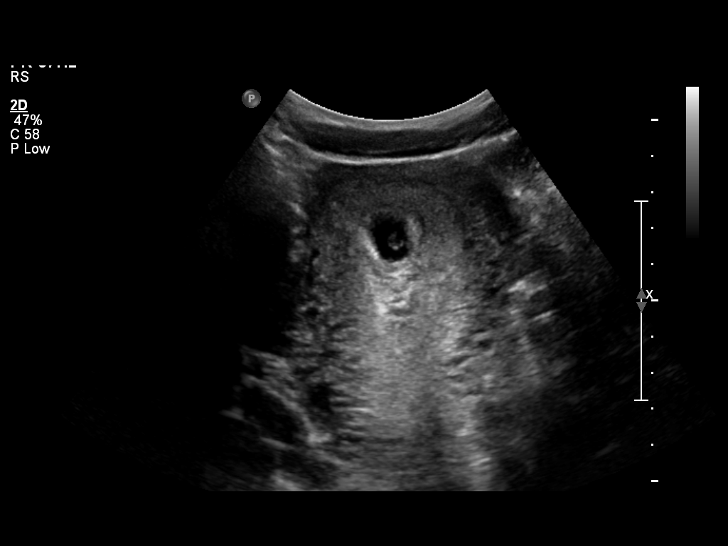
[im 4/30]
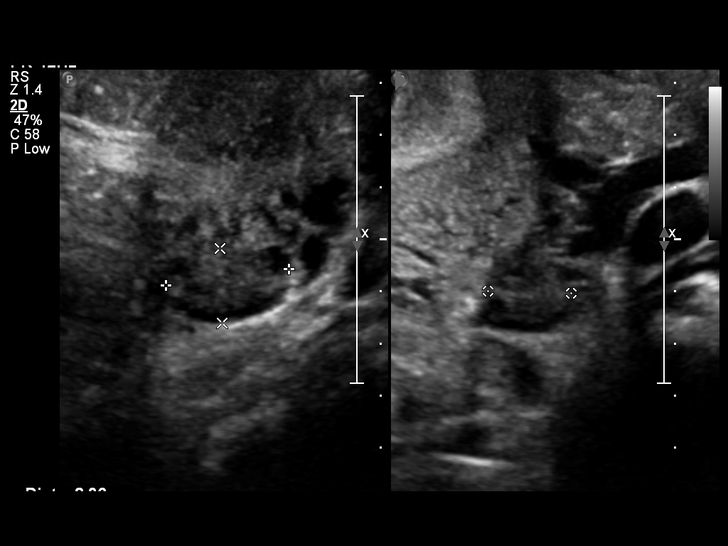
[im 6/30]
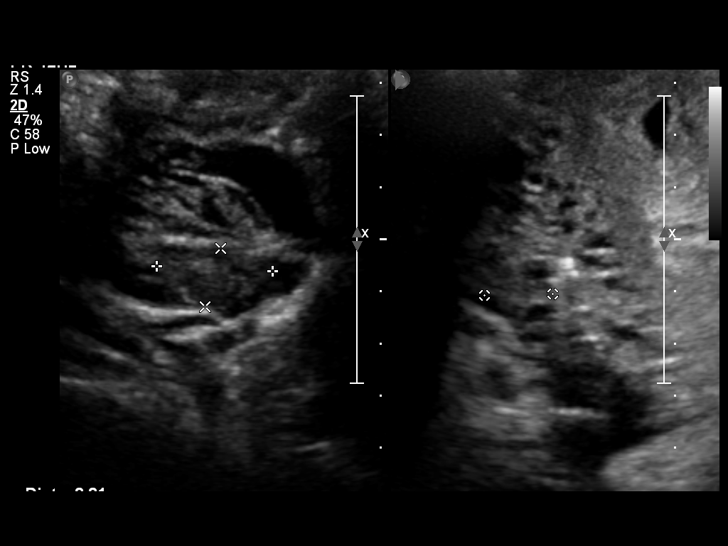
[im 8/30]
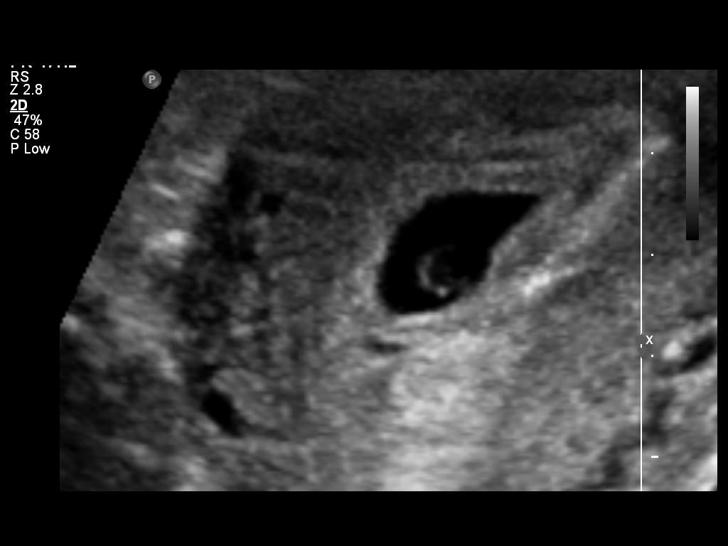
[im 10/30]
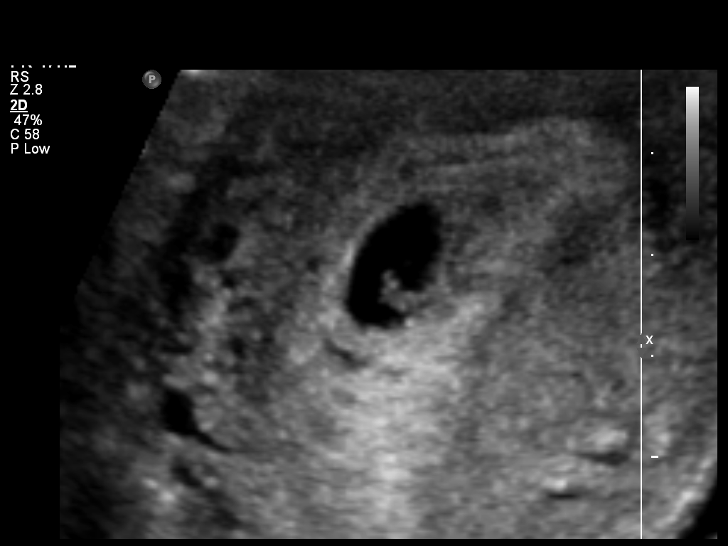
[im 12/30]
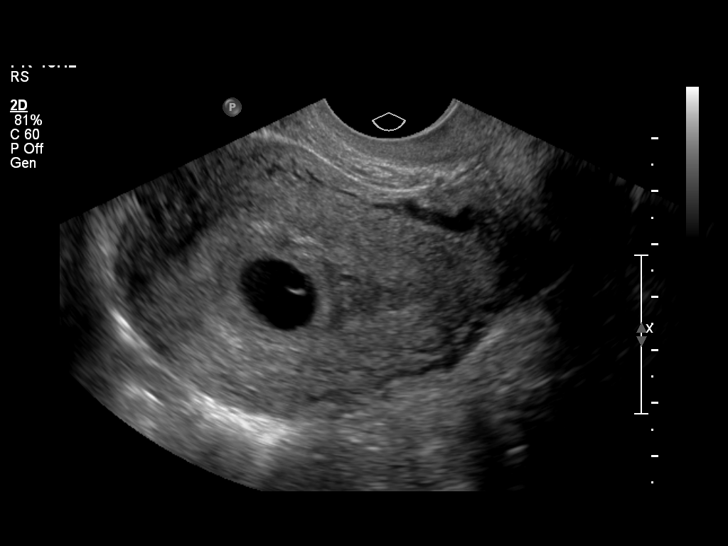
[im 14/30]
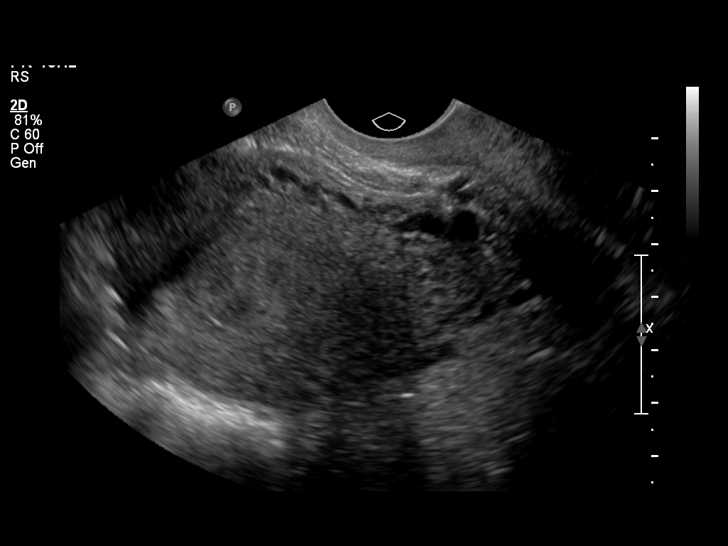
[im 17/30]
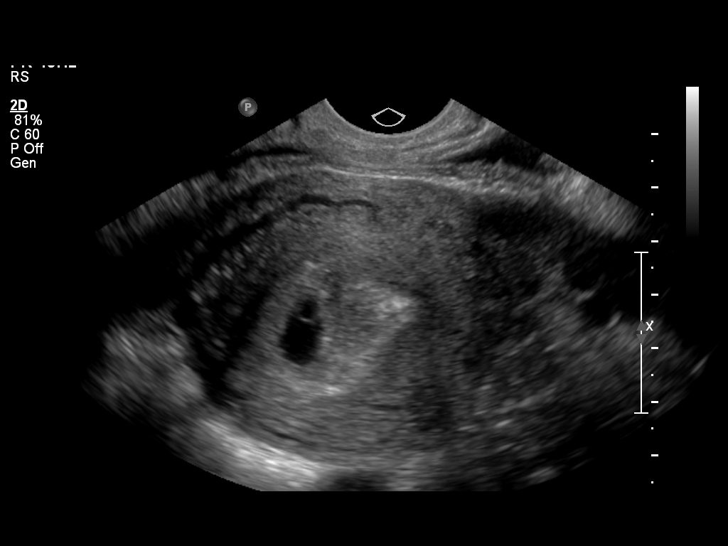
[im 19/30]
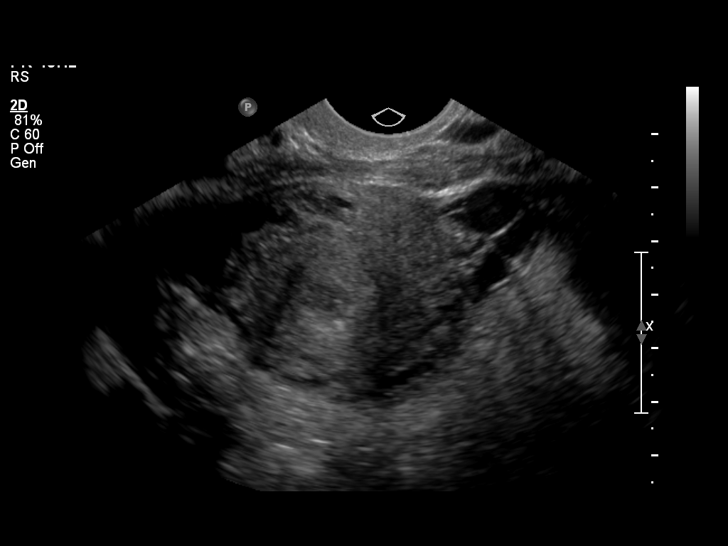
[im 21/30]
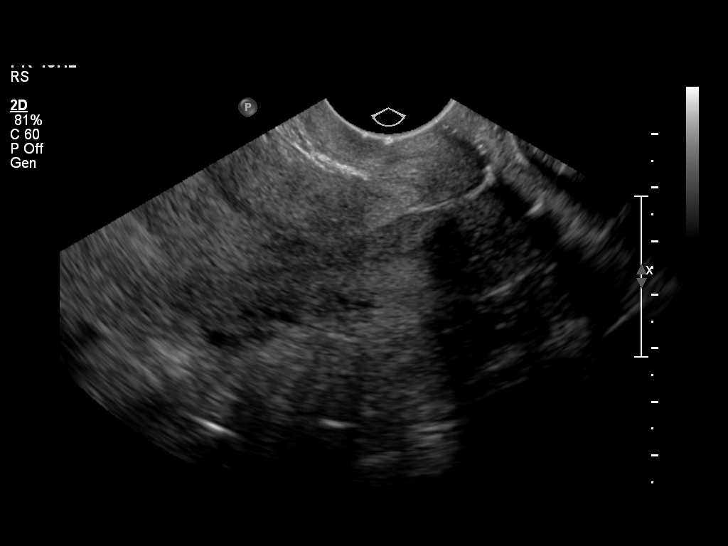
[im 23/30]
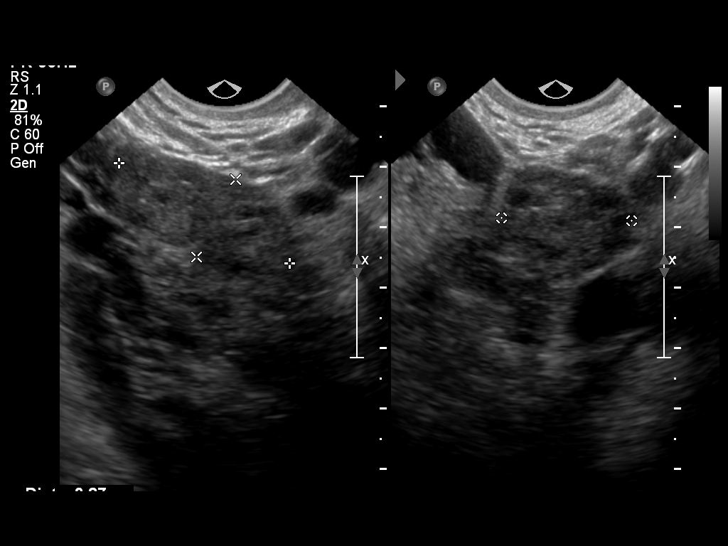
[im 25/30]
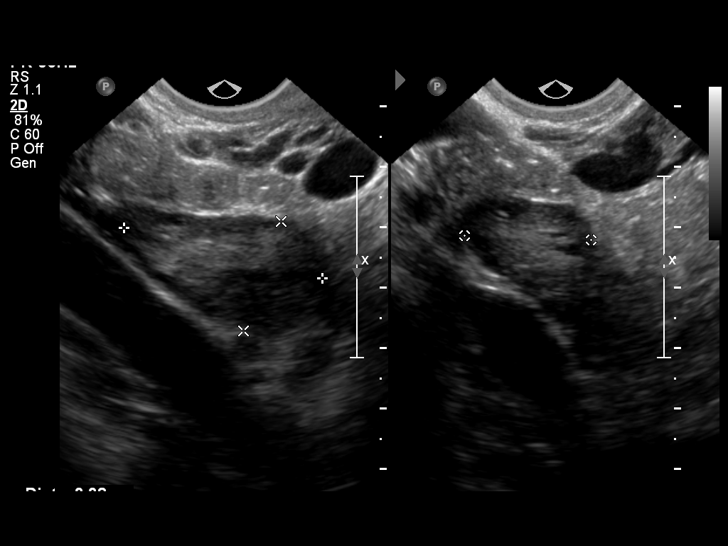
[im 27/30]
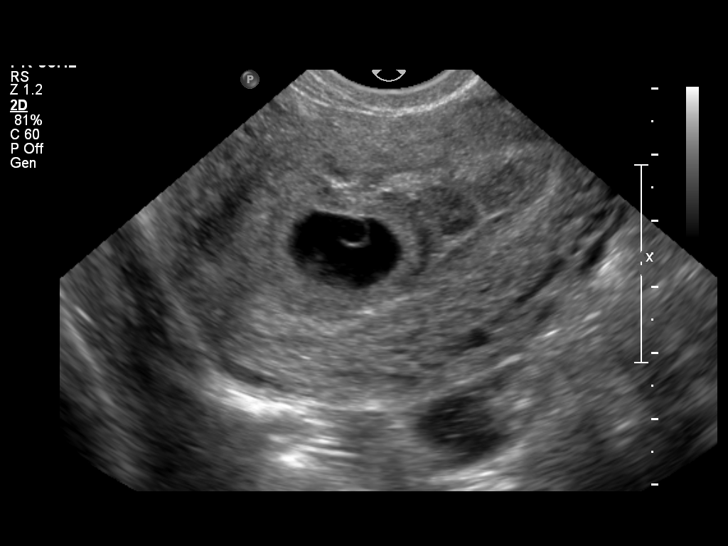
[im 30/30]
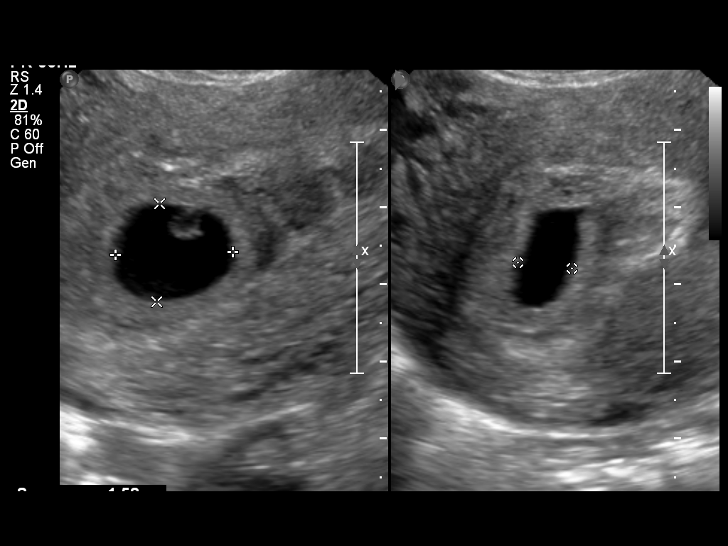

[14 of 28 positions shown; findings below may reference images not displayed]

Intrauterine gestational sac:  Visualized/normal in shape.
Yolk sac: Visualized
Embryo: Not visualized

MSD: 12   mm  5   w  6   d          US EDC: 04/29/2012

Maternal uterus/adnexae:
A small subchorionic hemorrhage noted.  Both ovaries are normal in
appearance.  No evidence of adnexal mass or free fluid.
IMPRESSION: 1.  Single intrauterine gestational sac measuring 6 weeks, which is
concordant with LMP.
2.  Small subchronic hemorrhage noted.

## 2012-11-27 ENCOUNTER — Encounter (HOSPITAL_COMMUNITY): Payer: Self-pay | Admitting: *Deleted

## 2012-11-27 ENCOUNTER — Inpatient Hospital Stay (HOSPITAL_COMMUNITY)
Admission: AD | Admit: 2012-11-27 | Discharge: 2012-11-27 | Disposition: A | Discharge status: Home / Self Care | Payer: BC Managed Care – PPO | Source: Ambulatory Visit | Attending: Obstetrics & Gynecology | Admitting: Obstetrics & Gynecology

## 2012-11-27 DIAGNOSIS — N63 Unspecified lump in unspecified breast: Secondary | ICD-10-CM | POA: Insufficient documentation

## 2012-11-27 DIAGNOSIS — N644 Mastodynia: Secondary | ICD-10-CM

## 2012-11-27 NOTE — MAU Note (Signed)
First noted lump in rt breast back in June.  Spoke with a nurse, it was right before her period, after cycle was gone.  Noted again the next month.  Now it is 2 wks after cycle and it is there.  Tender to touch.  No blood from nipple or discharge.

## 2012-11-27 NOTE — MAU Provider Note (Signed)
  History     CSN: [id number]  Arrival date and time: 11/27/12 7829   First Provider Initiated Contact with Patient 11/27/12 [phone]      Chief Complaint  Patient presents with  . Breast Mass   HPI  Pt is not pregnant with LMP 11/09/2012 .  Pt is not using anything for birth control. Pt presents with complaint of right breast lump that comes and goes with her cycle but has persisted through this cycle. Pt is concerned that it may be cancer and presents to the emergency room for evaluation. Pt has previously seen [facility] but did not follow up there with this complaint.  She called their office and they told her that she would need to follow up if it continued to be a concern.    Past Medical History  Diagnosis Date  . Abnormal Pap smear 2008    LEEP, repeat WNL    Past Surgical History  Procedure Date  . Leep     Family History  Problem Relation Age of Onset  . Other Neg Hx     History  Substance Use Topics  . Smoking status: Never Smoker   . Smokeless tobacco: Former Neurosurgeon  . Alcohol Use: No    Allergies: No Known Allergies  Prescriptions prior to admission  Medication Sig Dispense Refill  . [DISCONTINUED] Ascorbic Acid (VITAMIN C) 100 MG tablet Take 100 mg by mouth daily.        . [DISCONTINUED] misoprostol (CYTOTEC) 200 MCG tablet Take all four tabs by mouth as a single dose.  4 tablet  0  . [DISCONTINUED] prenatal vitamin w/FE, FA (PRENATAL 1 + 1) 27-1 MG TABS Take 1 tablet by mouth daily.        . [DISCONTINUED] vitamin B-12 (CYANOCOBALAMIN) 100 MCG tablet Take 50 mcg by mouth daily.          Review of Systems  Constitutional: Negative for fever.  Respiratory: Negative for cough.   Cardiovascular: Negative for chest pain.  Gastrointestinal: Negative for nausea, vomiting and abdominal pain.  Skin: Negative for itching and rash.   Physical Exam   Blood pressure 99/68, pulse 66, temperature 98.2 F (36.8 C), temperature source Oral, resp. rate 16,  height 5' 4.5" (1.638 m), weight 124 lb (56.246 kg), last menstrual period 11/09/2012.  Physical Exam  Nursing note and vitals reviewed. Constitutional: She appears well-developed and well-nourished.  Eyes: Pupils are equal, round, and reactive to light.  Neck: Normal range of motion. Neck supple.  Cardiovascular: Normal rate.   Respiratory: Effort normal.       Bilateral breast exam- no enlarged supraclavicular nodes or axillary nodes; small mildly tender, mobile BB density 12:30 position outer breast- no reddened areas or peu d'orange  GI: Soft.  Musculoskeletal: Normal range of motion.  Neurological: She is alert.  Skin: Skin is warm and dry.  Psychiatric: She has a normal mood and affect.    MAU Course  Procedures No results found for this or any previous visit (from the past 24 hour(s)). Lengthy discussion with pt and mother about breast changes and further evaluation  Pt left before discharge papers given and before pregnancy test obtained  Assessment and Plan  Right breast density Decrease caffeine Try Vitamin B6 50-100mg  F/u with [facility] for re-evaluation if persists or increases in size  Camry Theiss 11/27/2012, 8:31 AM

## 2012-12-18 ENCOUNTER — Other Ambulatory Visit: Payer: Self-pay | Admitting: Family Medicine

## 2012-12-18 DIAGNOSIS — N63 Unspecified lump in unspecified breast: Secondary | ICD-10-CM

## 2012-12-25 ENCOUNTER — Ambulatory Visit
Admission: RE | Admit: 2012-12-25 | Discharge: 2012-12-25 | Disposition: A | Discharge status: Home / Self Care | Payer: BC Managed Care – PPO | Source: Ambulatory Visit | Attending: Family Medicine | Admitting: Family Medicine

## 2012-12-25 DIAGNOSIS — N63 Unspecified lump in unspecified breast: Secondary | ICD-10-CM

## 2013-07-24 ENCOUNTER — Encounter (HOSPITAL_COMMUNITY): Payer: Self-pay | Admitting: *Deleted

## 2013-07-24 ENCOUNTER — Inpatient Hospital Stay (HOSPITAL_COMMUNITY): Payer: BC Managed Care – PPO

## 2013-07-24 ENCOUNTER — Inpatient Hospital Stay (HOSPITAL_COMMUNITY)
Admission: AD | Admit: 2013-07-24 | Discharge: 2013-07-24 | Disposition: A | Discharge status: Home / Self Care | Payer: BC Managed Care – PPO | Source: Ambulatory Visit | Attending: Obstetrics & Gynecology | Admitting: Obstetrics & Gynecology

## 2013-07-24 DIAGNOSIS — N949 Unspecified condition associated with female genital organs and menstrual cycle: Secondary | ICD-10-CM | POA: Insufficient documentation

## 2013-07-24 DIAGNOSIS — A5901 Trichomonal vulvovaginitis: Secondary | ICD-10-CM | POA: Insufficient documentation

## 2013-07-24 DIAGNOSIS — R109 Unspecified abdominal pain: Secondary | ICD-10-CM | POA: Insufficient documentation

## 2013-07-24 DIAGNOSIS — N83209 Unspecified ovarian cyst, unspecified side: Secondary | ICD-10-CM | POA: Insufficient documentation

## 2013-07-24 HISTORY — DX: Other specified bacterial agents as the cause of diseases classified elsewhere: B96.89

## 2013-07-24 HISTORY — DX: Trichomoniasis, unspecified: A59.9

## 2013-07-24 HISTORY — DX: Other specified bacterial agents as the cause of diseases classified elsewhere: N76.0

## 2013-07-24 HISTORY — DX: Anemia, unspecified: D64.9

## 2013-07-24 LAB — URINALYSIS, ROUTINE W REFLEX MICROSCOPIC
Bilirubin Urine: NEGATIVE
Glucose, UA: NEGATIVE mg/dL
Nitrite: NEGATIVE
Specific Gravity, Urine: >1.03 — ABNORMAL HIGH (ref 1.005–1.030)
pH: 6 (ref 5.0–8.0)

## 2013-07-24 LAB — CBC
Hemoglobin: 9.8 g/dL — ABNORMAL LOW (ref 12.0–15.0)
Platelets: 171 10*3/uL (ref 150–400)
RBC: 4.48 MIL/uL (ref 3.87–5.11)
WBC: 4.6 10*3/uL (ref 4.0–10.5)

## 2013-07-24 LAB — WET PREP, GENITAL
Clue Cells Wet Prep HPF POC: NONE SEEN
Yeast Wet Prep HPF POC: NONE SEEN

## 2013-07-24 MED ORDER — OXYCODONE-ACETAMINOPHEN 5-325 MG PO TABS: 2.0000 | ORAL_TABLET | ORAL | Status: DC | PRN

## 2013-07-24 MED ORDER — KETOROLAC TROMETHAMINE 60 MG/2ML IM SOLN
60.0000 mg | Freq: Once | INTRAMUSCULAR | Status: AC
  Administered 2013-07-24: 60 mg via INTRAMUSCULAR
  Filled 2013-07-24: qty 2

## 2013-07-24 MED ORDER — METRONIDAZOLE 500 MG PO TABS
2000.0000 mg | ORAL_TABLET | Freq: Once | ORAL | Status: AC
  Administered 2013-07-24: 2000 mg via ORAL
  Filled 2013-07-24: qty 4

## 2013-07-24 NOTE — MAU Provider Note (Signed)
History     CSN: [id number]  Arrival date and time: 07/24/13 0941   None     Chief Complaint  Patient presents with  . Abdominal Pain   HPIpt is not pregnancy and presents with abdominal pain since Sunday- 2 days.  Pt states she had diarrhea x 2 on Sunday.  Pt last had IC Sunday prior to IC.  Pt has RCM with LMP 07/02/2013.  Pt is not using anything for contraception.  Pt denies vaginal discharge, bleeding, UTI symptoms, nausea or vomiting. Pt took Pepto Bismal yesterday  RN note Patient states she has had abdominal pain since 8-17. Not sure about pregnancy. Denies bleeding or discharge.      Past Medical History  Diagnosis Date  . Abnormal Pap smear 2008    LEEP, repeat WNL    Past Surgical History  Procedure Laterality Date  . Leep      Family History  Problem Relation Age of Onset  . Other Neg Hx     History  Substance Use Topics  . Smoking status: Never Smoker   . Smokeless tobacco: Former Neurosurgeon  . Alcohol Use: No    Allergies: No Known Allergies  No prescriptions prior to admission    Review of Systems  Constitutional: Negative for fever and chills.  Gastrointestinal: Positive for abdominal pain and diarrhea. Negative for nausea and vomiting.  Genitourinary: Negative for dysuria and urgency.   Physical Exam   Height 5\' 5"  (1.651 m), weight 56.609 kg (124 lb 12.8 oz).  Physical Exam  Vitals reviewed. Constitutional: She is oriented to person, place, and time. She appears well-developed and well-nourished. No distress.  HENT:  Head: Normocephalic.  Eyes: Pupils are equal, round, and reactive to light.  Cardiovascular: Normal rate.   Respiratory: Effort normal.  GI: Soft.  Genitourinary:  Mod amount of frothy white discharge in vault; cervix tender to touch; bimanual diffusely tender with palpation without rebound.  No palpable enlargement of uterus or ovaries.   Musculoskeletal: Normal range of motion.  Neurological: She is alert and oriented to  person, place, and time.  Skin: Skin is warm and dry.  Psychiatric: She has a normal mood and affect.    MAU Course  Procedures CBC Wet prep GC/Chlamdyia Pelvic US Toradol 60mg  IM for pain Results for orders placed during the hospital encounter of 07/24/13 (from the past 24 hour(s))  URINALYSIS, ROUTINE W REFLEX MICROSCOPIC     Status: Abnormal   Collection Time    07/24/13 10:15 AM      Result Value Range   Color, Urine YELLOW  YELLOW   APPearance HAZY (*) CLEAR   Specific Gravity, Urine >1.030 (*) 1.005 - 1.030   pH 6.0  5.0 - 8.0   Glucose, UA NEGATIVE  NEGATIVE mg/dL   Hgb urine dipstick NEGATIVE  NEGATIVE   Bilirubin Urine NEGATIVE  NEGATIVE   Ketones, ur NEGATIVE  NEGATIVE mg/dL   Protein, ur NEGATIVE  NEGATIVE mg/dL   Urobilinogen, UA 0.2  0.0 - 1.0 mg/dL   Nitrite NEGATIVE  NEGATIVE   Leukocytes, UA TRACE (*) NEGATIVE  URINE MICROSCOPIC-ADD ON     Status: Abnormal   Collection Time    07/24/13 10:15 AM      Result Value Range   Squamous Epithelial / LPF FEW (*) RARE   WBC, UA 0-2  <3 WBC/hpf   Bacteria, UA FEW (*) RARE   Urine-Other MUCOUS PRESENT    POCT PREGNANCY, URINE     Status:  None   Collection Time    07/24/13 10:18 AM      Result Value Range   Preg Test, Ur NEGATIVE  NEGATIVE  CBC     Status: Abnormal   Collection Time    07/24/13 10:40 AM      Result Value Range   WBC 4.6  4.0 - 10.5 K/uL   RBC 4.48  3.87 - 5.11 MIL/uL   Hemoglobin 9.8 (*) 12.0 - 15.0 g/dL   HCT 09.8 (*) 11.9 - 14.7 %   MCV 70.8 (*) 78.0 - 100.0 fL   MCH 21.9 (*) 26.0 - 34.0 pg   MCHC 30.9  30.0 - 36.0 g/dL   RDW 82.9  56.2 - 13.0 %   Platelets 171  150 - 400 K/uL  WET PREP, GENITAL     Status: Abnormal   Collection Time    07/24/13 10:46 AM      Result Value Range   Yeast Wet Prep HPF POC NONE SEEN  NONE SEEN   Trich, Wet Prep MODERATE (*) NONE SEEN   Clue Cells Wet Prep HPF POC NONE SEEN  NONE SEEN   WBC, Wet Prep HPF POC FEW (*) NONE SEEN  US Transvaginal  Non-ob  07/24/2013   CLINICAL DATA:  Pelvic pain.  EXAM: TRANSABDOMINAL AND TRANSVAGINAL ULTRASOUND OF PELVIS  TECHNIQUE: Both transabdominal and transvaginal ultrasound examinations of the pelvis were performed. Transabdominal technique was performed for global imaging of the pelvis including uterus, ovaries, adnexal regions, and pelvic cul-de-sac. It was necessary to proceed with endovaginal exam following the transabdominal exam to visualize the uterus and adnexa.  COMPARISON:  09/08/2011  FINDINGS: Uterus: 8.9 x 4.5 x 4.5 cm. Normal echotexture. No focal abnormality.  Endometrium: Normal appearance and thickness, 10 mm.  Right ovary: 3.7 x 2.3 x 2.1 cm. Normal size and echotexture. No adnexal masses.  Left ovary: 4.0 x 3.6 x 3.7 cm. There is a complex and irregular cyst measuring 3.5 x 3.3 x 2.9 cm. This may represent a collapsing hemorrhagic cyst.  Other findings:  Moderate complex free fluid within the pelvis.  IMPRESSION: Normal study. No evidence of pelvic mass or other significant abnormality.  Irregular complex left ovarian cyst measuring 3.5 cm. This may represent a collapsing hemorrhagic cyst. Complex fluid in the pelvis may represent fluid from a ruptured hemorrhagic cyst.   Electronically Signed   By: Charlett Nose   On: 07/24/2013 12:17   US Pelvis Complete  07/24/2013   CLINICAL DATA:  Pelvic pain.  EXAM: TRANSABDOMINAL AND TRANSVAGINAL ULTRASOUND OF PELVIS  TECHNIQUE: Both transabdominal and transvaginal ultrasound examinations of the pelvis were performed. Transabdominal technique was performed for global imaging of the pelvis including uterus, ovaries, adnexal regions, and pelvic cul-de-sac. It was necessary to proceed with endovaginal exam following the transabdominal exam to visualize the uterus and adnexa.  COMPARISON:  09/08/2011  FINDINGS: Uterus: 8.9 x 4.5 x 4.5 cm. Normal echotexture. No focal abnormality.  Endometrium: Normal appearance and thickness, 10 mm.  Right ovary: 3.7 x 2.3 x  2.1 cm. Normal size and echotexture. No adnexal masses.  Left ovary: 4.0 x 3.6 x 3.7 cm. There is a complex and irregular cyst measuring 3.5 x 3.3 x 2.9 cm. This may represent a collapsing hemorrhagic cyst.  Other findings:  Moderate complex free fluid within the pelvis.  IMPRESSION: Normal study. No evidence of pelvic mass or other significant abnormality.  Irregular complex left ovarian cyst measuring 3.5 cm. This may represent a collapsing  hemorrhagic cyst. Complex fluid in the pelvis may represent fluid from a ruptured hemorrhagic cyst.   Electronically Signed   By: Charlett Nose   On: 07/24/2013 12:17   Treated for Trich in MAU- discussed that partner needs to be treated GC/chlamdyia pending Assessment and Plan  Trich vaginosis- treated in MAU with Flagyl Collapsing hemorrhagic cyst- Percocet  F/u with provider of choice- pt has previously seen Dr. Princella Ion 07/24/2013, 10:03 AM

## 2013-07-24 NOTE — MAU Provider Note (Signed)
Attestation of Attending Supervision of Advanced Practitioner (CNM/NP): Evaluation and management procedures were performed by the Advanced Practitioner under my supervision and collaboration.  I have reviewed the Advanced Practitioner's note and chart, and I agree with the management and plan.  HARRAWAY-SMITH, Rhena Glace 3:52 PM     

## 2013-07-24 NOTE — MAU Note (Signed)
Patient states she has had abdominal pain since 8-17. Not sure about pregnancy. Denies bleeding or discharge.

## 2013-11-02 ENCOUNTER — Other Ambulatory Visit (HOSPITAL_COMMUNITY): Payer: Self-pay | Admitting: Obstetrics and Gynecology

## 2013-11-02 DIAGNOSIS — N970 Female infertility associated with anovulation: Secondary | ICD-10-CM

## 2013-11-08 ENCOUNTER — Ambulatory Visit (HOSPITAL_COMMUNITY)
Admission: RE | Admit: 2013-11-08 | Discharge: 2013-11-08 | Disposition: A | Discharge status: Home / Self Care | Payer: BC Managed Care – PPO | Source: Ambulatory Visit | Attending: Obstetrics and Gynecology | Admitting: Obstetrics and Gynecology

## 2013-11-08 DIAGNOSIS — N979 Female infertility, unspecified: Secondary | ICD-10-CM | POA: Insufficient documentation

## 2013-11-08 DIAGNOSIS — N970 Female infertility associated with anovulation: Secondary | ICD-10-CM

## 2013-11-08 MED ORDER — IOHEXOL 300 MG/ML  SOLN
20.0000 mL | Freq: Once | INTRAMUSCULAR | Status: AC | PRN
  Administered 2013-11-08: 20 mL

## 2013-11-12 ENCOUNTER — Emergency Department (HOSPITAL_COMMUNITY)
Admission: EM | Admit: 2013-11-12 | Discharge: 2013-11-12 | Disposition: A | Discharge status: Home / Self Care | Payer: BC Managed Care – PPO | Source: Home / Self Care | Attending: Family Medicine | Admitting: Family Medicine

## 2013-11-12 ENCOUNTER — Encounter (HOSPITAL_COMMUNITY): Payer: Self-pay | Admitting: Emergency Medicine

## 2013-11-12 DIAGNOSIS — J029 Acute pharyngitis, unspecified: Secondary | ICD-10-CM

## 2013-11-12 LAB — POCT RAPID STREP A: Streptococcus, Group A Screen (Direct): NEGATIVE

## 2013-11-12 NOTE — ED Provider Notes (Signed)
CSN: [id number]     Arrival date & time 11/12/13  0802 History   First MD Initiated Contact with Patient 11/12/13 0827     Chief Complaint  Patient presents with  . Sore Throat    Patient is a 32 y.o. female presenting with pharyngitis.  Sore Throat  32 yo female who presents for evaluation of sore throat. Her daughter has had sore throat and fever, concerning for strep throat in the last couple days. Yesterday, the patient drank after her daughter and this morning woke up with a mild sore throat. She denies any fevers. No cough, no congestion, no difficulty breathing. She has been able to eat and drink normally. She has not taken any medicine for it. She came to be evaluated in case this is early strep throat.  Past Medical History  Diagnosis Date  . Abnormal Pap smear 2008    LEEP, repeat WNL  . Bacterial vaginosis   . Trichomonas     07/24/2013  . Anemia    Past Surgical History  Procedure Laterality Date  . Leep    . Wisdom tooth extraction      X 2 teeth   Family History  Problem Relation Age of Onset  . Other Neg Hx    History  Substance Use Topics  . Smoking status: Never Smoker   . Smokeless tobacco: Former Neurosurgeon  . Alcohol Use: No   OB History   Grav Para Term Preterm Abortions TAB SAB Ect Mult Living   2 1 1  0 1 0 1 0 0 1     Review of Systems Negative except per history of present illness Allergies  Review of patient's allergies indicates no known allergies.  Home Medications   Current Outpatient Rx  Name  Route  Sig  Dispense  Refill  . aspirin-acetaminophen-caffeine (EXCEDRIN MIGRAINE) 250-250-65 MG per tablet   Oral   Take 2 tablets by mouth every 6 (six) hours as needed for pain.         Marland Kitchen bismuth subsalicylate (PEPTO BISMOL) 262 MG/15ML suspension   Oral   Take 15 mL by mouth every 6 (six) hours as needed for indigestion.         Marland Kitchen oxyCODONE-acetaminophen (PERCOCET/ROXICET) 5-325 MG per tablet   Oral   Take 2 tablets by mouth every 4  (four) hours as needed for pain.   15 tablet   0    BP 101/70  Pulse 65  Temp(Src) 99.1 F (37.3 C) (Oral)  Resp 16  SpO2 100%  LMP 11/01/2013 Physical Exam General: No acute distress, alert and oriented x4 HEENT: Oropharynx clear, mild erythema and cobblestone pattern, no tonsillar exudates, mild congestion and erythema of nasal turbinates, tympanic membranes clear bilaterally, no sinus tenderness, no cervical lymphadenopathy Respiratory: Clear auscultation bilaterally, no crackles, no wheezing Cardiovascular: S1-S2, regular rate and rhythm, no murmur ED Course  Procedures (including critical care time) Labs Review Labs Reviewed - No data to display Imaging Review No results found.  MDM  No diagnosis found.  Rapid strep negative. Culture sent.  Likely viral URI vs allergic postnasal drip. Treat symptomatically with tylenol/ibuprofen for sore throat.  Red flags for return to care reviewed: worsening symptoms, fever, increased work of breathing  Marena Chancy, PGY-3 Family Medicine Resident    Lonia Skinner, MD 11/12/13 [phone]

## 2013-11-13 NOTE — ED Provider Notes (Signed)
Medical screening examination/treatment/procedure(s) were performed by a resident physician or non-physician practitioner and as the supervising physician I was immediately available for consultation/collaboration.  Dion Sibal, MD    Myha Arizpe S Nyilah Kight, MD 11/13/13 0823 

## 2013-11-14 LAB — CULTURE, GROUP A STREP

## 2013-12-06 NOTE — L&D Delivery Note (Signed)
Delivery Note At 7:01 PM a viable and healthy female was delivered via Vaginal, Spontaneous Delivery (Presentation: Right Occiput Anterior).  APGAR: 8, 9; weight pending.   Placenta status: Intact, Spontaneous.  Cord: 3 vessels with the following complications: None.  Cord pH: none  Anesthesia: Epidural  Episiotomy: None Lacerations: 1st degree;Perineal Suture Repair: 3.0 chromic Est. Blood Loss (mL): 250  Mom to postpartum.  Baby to Couplet care / Skin to Skin.  Tziporah Knoke A 08/28/2014, 7:30 PM

## 2014-01-30 LAB — OB RESULTS CONSOLE ABO/RH: RH Type: POSITIVE

## 2014-01-30 LAB — OB RESULTS CONSOLE ANTIBODY SCREEN: Antibody Screen: NEGATIVE

## 2014-01-30 LAB — OB RESULTS CONSOLE RUBELLA ANTIBODY, IGM: Rubella: IMMUNE

## 2014-01-30 LAB — OB RESULTS CONSOLE HIV ANTIBODY (ROUTINE TESTING): HIV: NONREACTIVE

## 2014-01-30 LAB — OB RESULTS CONSOLE HEPATITIS B SURFACE ANTIGEN: Hepatitis B Surface Ag: NEGATIVE

## 2014-02-14 LAB — OB RESULTS CONSOLE GC/CHLAMYDIA
Chlamydia: NEGATIVE
Gonorrhea: NEGATIVE

## 2014-05-27 IMAGING — RF DG HYSTEROGRAM
5 series · 10 of 10 positions shown · IV contrast (omnipaque)
Comparison: None.

FLUOROSCOPY TIME:  1 min, 12 seconds

CLINICAL DATA: Secondary infertility.

EXAM:
HYSTEROSALPINGOGRAM
TECHNIQUE: Following cleansing of the cervix and vagina with Betadine solution,
a hysterosalpingogram was performed using a 5-French
hysterosalpingogram catheter and Omnipaque 300 contrast. The patient
tolerated the examination without difficulty.

[Series 1: run · 2 of 2 slices shown (1 of 5)]
[im 1/2]
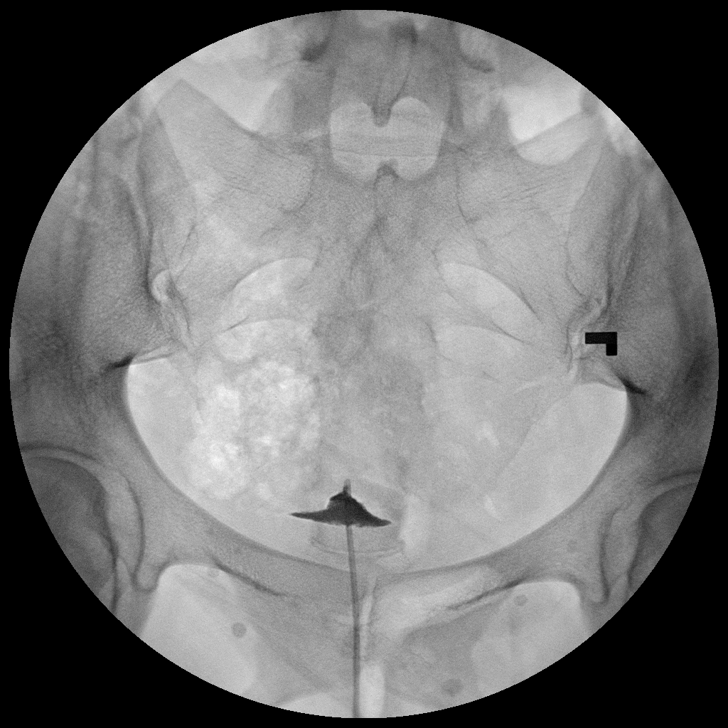
[im 2/2]
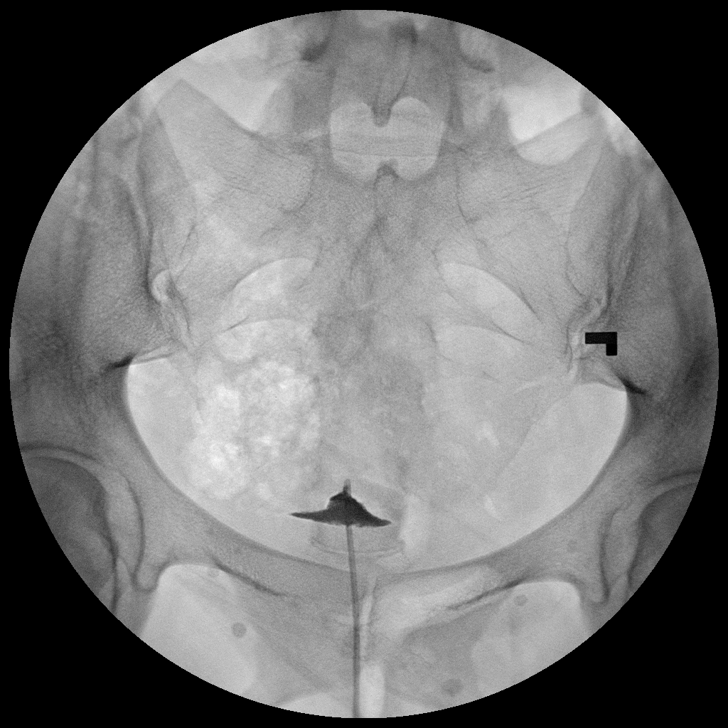

[Series 2: run · 2 of 2 slices shown (2 of 5)]
[im 1/2]
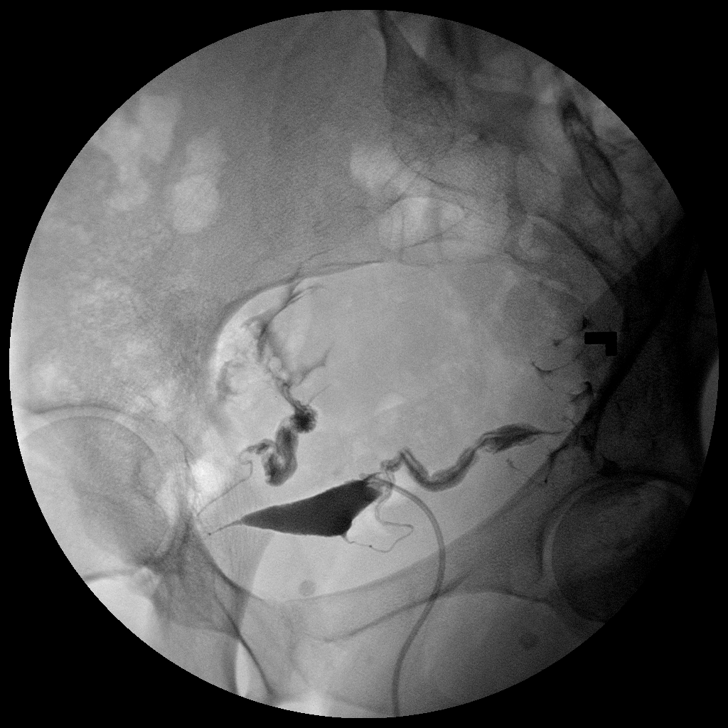
[im 2/2]
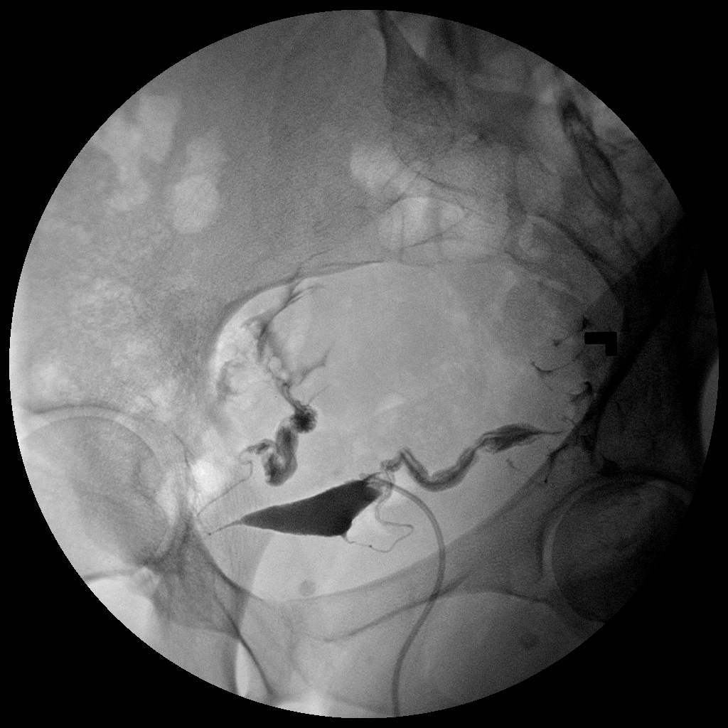

[Series 3: run · 2 of 2 slices shown (3 of 5)]
[im 1/2]
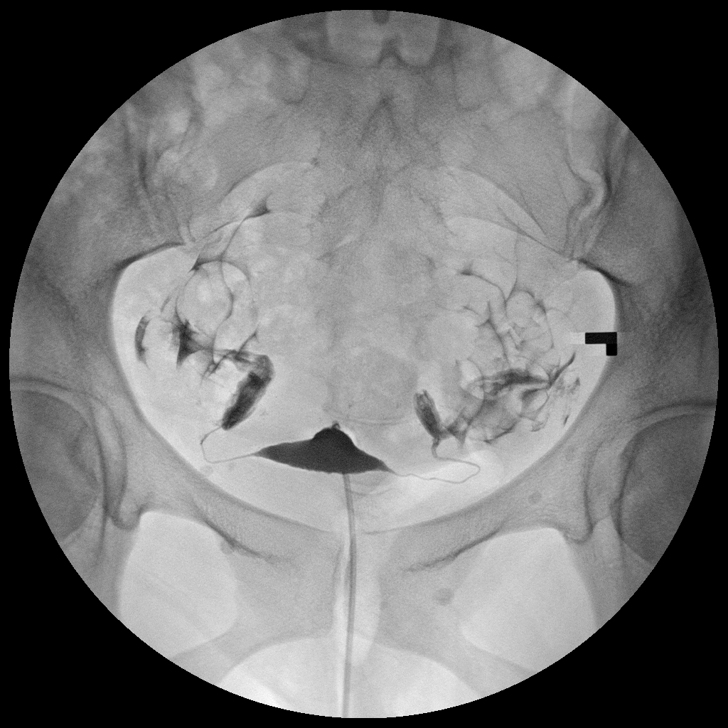
[im 2/2]
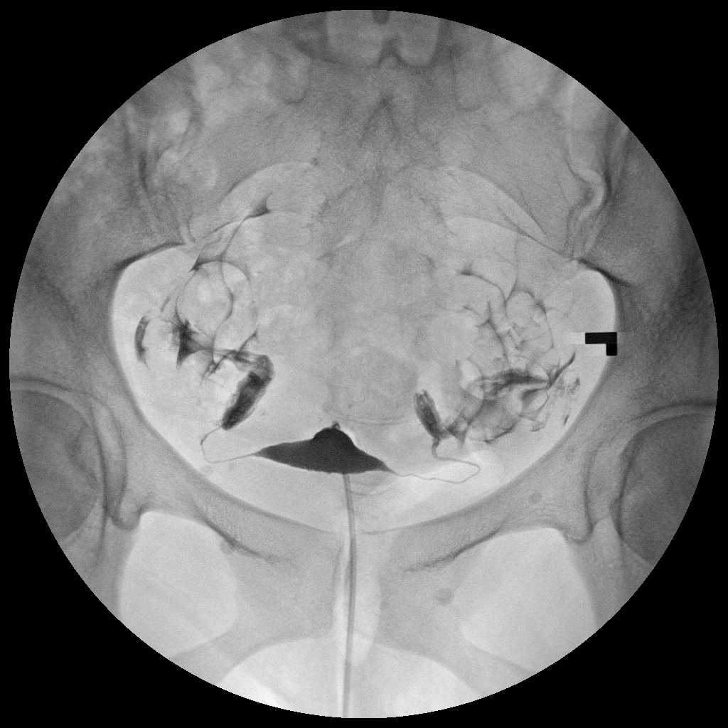

[Series 4: run · 2 of 2 slices shown (4 of 5)]
[im 1/2]
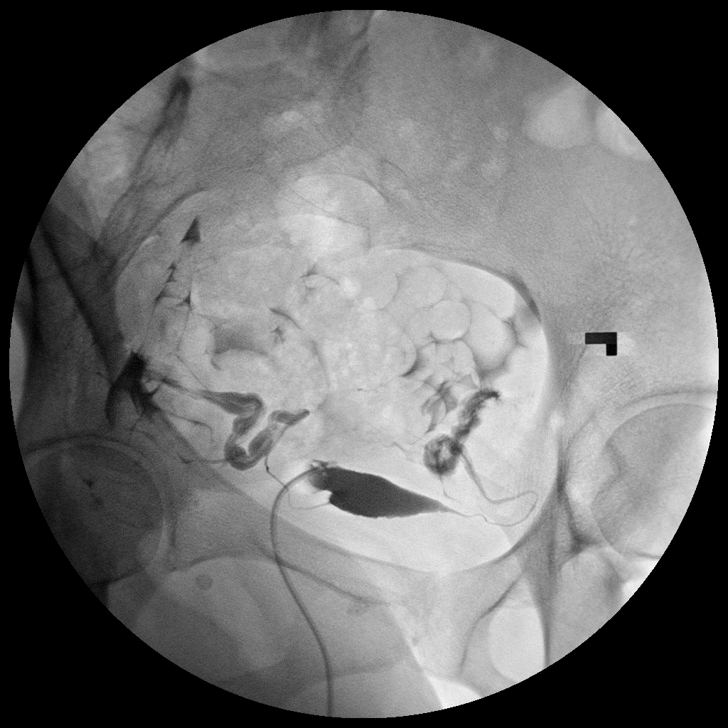
[im 2/2]
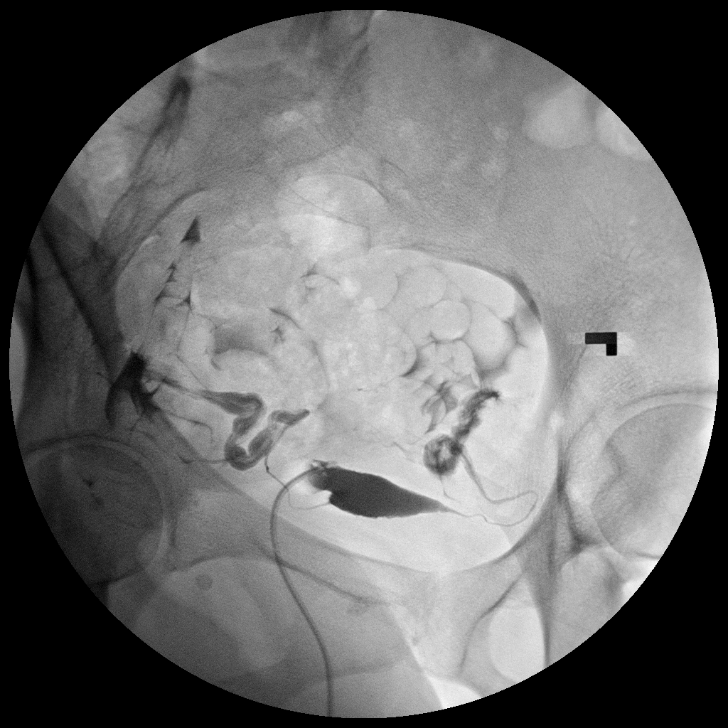

[Series 5: run · 2 of 2 slices shown (5 of 5)]
[im 1/2]
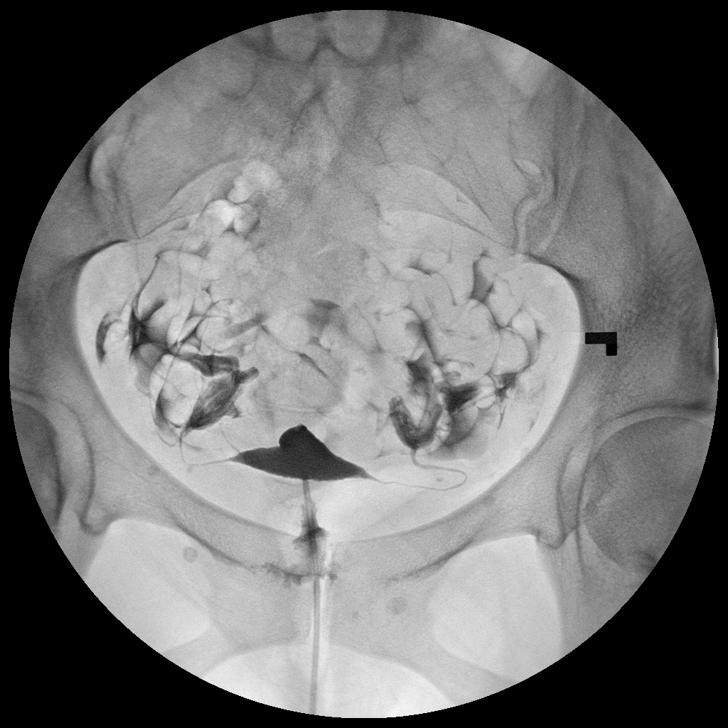
[im 2/2]
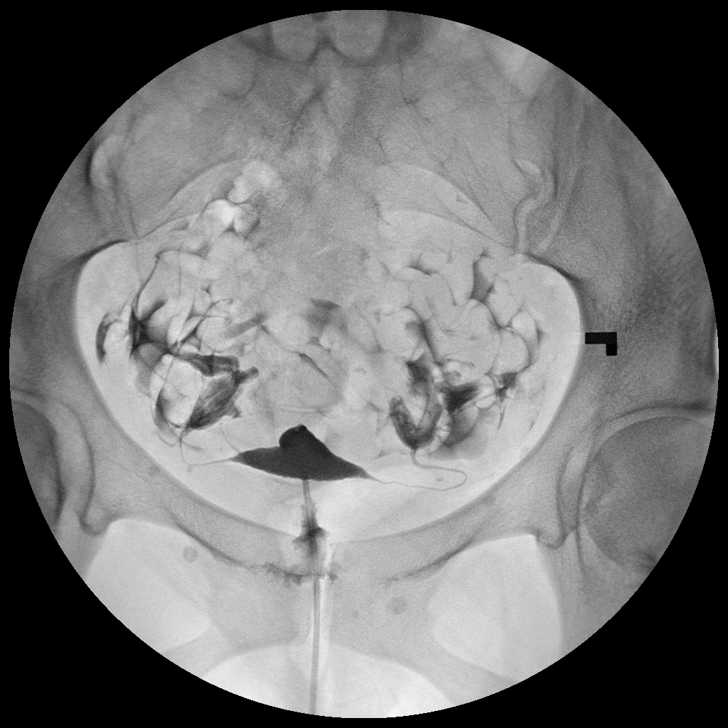

[10 of 10 positions shown; findings below may reference images not displayed]

FINDINGS: The uterine contour is normal. There is normal contour of the
fallopian tubes bilaterally. Bilateral peritoneal spill is
identified.
IMPRESSION: Normal HSG.

## 2014-06-20 LAB — OB RESULTS CONSOLE RPR: RPR: NONREACTIVE

## 2014-08-09 LAB — OB RESULTS CONSOLE GBS: STREP GROUP B AG: NEGATIVE

## 2014-08-23 ENCOUNTER — Inpatient Hospital Stay (HOSPITAL_COMMUNITY)
Admission: AD | Admit: 2014-08-23 | Discharge: 2014-08-23 | Disposition: A | Discharge status: Home / Self Care | Payer: BC Managed Care – PPO | Source: Ambulatory Visit | Attending: Obstetrics and Gynecology | Admitting: Obstetrics and Gynecology

## 2014-08-23 ENCOUNTER — Encounter (HOSPITAL_COMMUNITY): Payer: Self-pay | Admitting: *Deleted

## 2014-08-23 DIAGNOSIS — O479 False labor, unspecified: Secondary | ICD-10-CM | POA: Insufficient documentation

## 2014-08-23 NOTE — Discharge Instructions (Signed)

## 2014-08-23 NOTE — Progress Notes (Signed)
Dr Cherly Hensen notified of pt's VE, FHR pattern, contraction pattern with no change in VE after one hour. Orders received to discharge home

## 2014-08-23 NOTE — MAU Note (Signed)
Patient states she is having contractions every 5-8 minutes. Denies bleeding or leaking and reports good fetal movement.

## 2014-08-23 NOTE — MAU Note (Signed)
Pt presents to MAU with complaints of contractions that started last night but have gotten worse this morning. Reports contractions are every 5 to 8 mins.

## 2014-08-28 ENCOUNTER — Encounter (HOSPITAL_COMMUNITY): Payer: BC Managed Care – PPO | Admitting: Anesthesiology

## 2014-08-28 ENCOUNTER — Inpatient Hospital Stay (HOSPITAL_COMMUNITY)
Admission: AD | Admit: 2014-08-28 | Discharge: 2014-08-30 | DRG: 775 | Disposition: A | Discharge status: Home / Self Care | Payer: BC Managed Care – PPO | Source: Ambulatory Visit | Attending: Obstetrics and Gynecology | Admitting: Obstetrics and Gynecology

## 2014-08-28 ENCOUNTER — Inpatient Hospital Stay (HOSPITAL_COMMUNITY): Payer: BC Managed Care – PPO | Admitting: Anesthesiology

## 2014-08-28 ENCOUNTER — Encounter (HOSPITAL_COMMUNITY): Payer: Self-pay | Admitting: *Deleted

## 2014-08-28 DIAGNOSIS — D696 Thrombocytopenia, unspecified: Secondary | ICD-10-CM | POA: Diagnosis present

## 2014-08-28 DIAGNOSIS — O9912 Other diseases of the blood and blood-forming organs and certain disorders involving the immune mechanism complicating childbirth: Secondary | ICD-10-CM | POA: Diagnosis present

## 2014-08-28 DIAGNOSIS — Z87891 Personal history of nicotine dependence: Secondary | ICD-10-CM

## 2014-08-28 DIAGNOSIS — D689 Coagulation defect, unspecified: Secondary | ICD-10-CM | POA: Diagnosis present

## 2014-08-28 DIAGNOSIS — O479 False labor, unspecified: Secondary | ICD-10-CM | POA: Diagnosis present

## 2014-08-28 DIAGNOSIS — IMO0001 Reserved for inherently not codable concepts without codable children: Secondary | ICD-10-CM

## 2014-08-28 HISTORY — DX: Unspecified abnormal cytological findings in specimens from vagina: R87.629

## 2014-08-28 LAB — CBC
HCT: 36.5 % (ref 36.0–46.0)
Hemoglobin: 11.6 g/dL — ABNORMAL LOW (ref 12.0–15.0)
MCH: 23.1 pg — AB (ref 26.0–34.0)
MCHC: 31.8 g/dL (ref 30.0–36.0)
MCV: 72.6 fL — ABNORMAL LOW (ref 78.0–100.0)
PLATELETS: 134 10*3/uL — AB (ref 150–400)
RBC: 5.03 MIL/uL (ref 3.87–5.11)
RDW: 15.3 % (ref 11.5–15.5)
WBC: 11 10*3/uL — AB (ref 4.0–10.5)

## 2014-08-28 LAB — RPR

## 2014-08-28 LAB — TYPE AND SCREEN
ABO/RH(D): O POS
Antibody Screen: NEGATIVE

## 2014-08-28 MED ORDER — OXYTOCIN 40 UNITS IN LACTATED RINGERS INFUSION - SIMPLE MED
62.5000 mL/h | INTRAVENOUS | Status: DC
  Filled 2014-08-28: qty 1000

## 2014-08-28 MED ORDER — OXYTOCIN 40 UNITS IN LACTATED RINGERS INFUSION - SIMPLE MED
1.0000 m[IU]/min | INTRAVENOUS | Status: DC
  Administered 2014-08-28: 2 m[IU]/min via INTRAVENOUS

## 2014-08-28 MED ORDER — FENTANYL 2.5 MCG/ML BUPIVACAINE 1/10 % EPIDURAL INFUSION (WH - ANES)
14.0000 mL/h | INTRAMUSCULAR | Status: DC | PRN
  Filled 2014-08-28: qty 125

## 2014-08-28 MED ORDER — OXYCODONE-ACETAMINOPHEN 5-325 MG PO TABS
1.0000 | ORAL_TABLET | ORAL | Status: DC | PRN
  Administered 2014-08-29 – 2014-08-30 (×2): 1 via ORAL
  Filled 2014-08-28 (×2): qty 1

## 2014-08-28 MED ORDER — LIDOCAINE HCL (PF) 1 % IJ SOLN
INTRAMUSCULAR | Status: DC | PRN
  Administered 2014-08-28: 4 mL
  Administered 2014-08-28: 6 mL

## 2014-08-28 MED ORDER — PHENYLEPHRINE 40 MCG/ML (10ML) SYRINGE FOR IV PUSH (FOR BLOOD PRESSURE SUPPORT)
80.0000 ug | PREFILLED_SYRINGE | INTRAVENOUS | Status: DC | PRN
  Filled 2014-08-28: qty 2

## 2014-08-28 MED ORDER — DIPHENHYDRAMINE HCL 25 MG PO CAPS: 25.0000 mg | ORAL_CAPSULE | Freq: Four times a day (QID) | ORAL | Status: DC | PRN

## 2014-08-28 MED ORDER — SENNOSIDES-DOCUSATE SODIUM 8.6-50 MG PO TABS
2.0000 | ORAL_TABLET | ORAL | Status: DC
  Administered 2014-08-28 – 2014-08-30 (×2): 2 via ORAL
  Filled 2014-08-28 (×2): qty 2

## 2014-08-28 MED ORDER — OXYCODONE-ACETAMINOPHEN 5-325 MG PO TABS
2.0000 | ORAL_TABLET | ORAL | Status: DC | PRN
Start: 2014-08-28 — End: 2014-08-30

## 2014-08-28 MED ORDER — ONDANSETRON HCL 4 MG/2ML IJ SOLN: 4.0000 mg | Freq: Four times a day (QID) | INTRAMUSCULAR | Status: DC | PRN

## 2014-08-28 MED ORDER — FLEET ENEMA 7-19 GM/118ML RE ENEM: 1.0000 | ENEMA | RECTAL | Status: DC | PRN

## 2014-08-28 MED ORDER — LIDOCAINE HCL (PF) 1 % IJ SOLN
30.0000 mL | INTRAMUSCULAR | Status: DC | PRN
  Administered 2014-08-28: 30 mL via SUBCUTANEOUS
  Filled 2014-08-28: qty 30

## 2014-08-28 MED ORDER — LACTATED RINGERS IV SOLN
INTRAVENOUS | Status: DC
  Administered 2014-08-28: 15:00:00 via INTRAVENOUS

## 2014-08-28 MED ORDER — DIPHENHYDRAMINE HCL 50 MG/ML IJ SOLN: 12.5000 mg | INTRAMUSCULAR | Status: DC | PRN

## 2014-08-28 MED ORDER — EPHEDRINE 5 MG/ML INJ
10.0000 mg | INTRAVENOUS | Status: DC | PRN
  Filled 2014-08-28: qty 2

## 2014-08-28 MED ORDER — ONDANSETRON HCL 4 MG PO TABS: 4.0000 mg | ORAL_TABLET | ORAL | Status: DC | PRN

## 2014-08-28 MED ORDER — ZOLPIDEM TARTRATE 5 MG PO TABS
5.0000 mg | ORAL_TABLET | Freq: Every evening | ORAL | Status: DC | PRN
Start: 2014-08-28 — End: 2014-08-30

## 2014-08-28 MED ORDER — SIMETHICONE 80 MG PO CHEW: 80.0000 mg | CHEWABLE_TABLET | ORAL | Status: DC | PRN

## 2014-08-28 MED ORDER — LACTATED RINGERS IV SOLN
500.0000 mL | INTRAVENOUS | Status: DC | PRN
  Administered 2014-08-28: 1000 mL via INTRAVENOUS

## 2014-08-28 MED ORDER — IBUPROFEN 600 MG PO TABS
600.0000 mg | ORAL_TABLET | Freq: Four times a day (QID) | ORAL | Status: DC
  Administered 2014-08-28 – 2014-08-30 (×6): 600 mg via ORAL
  Filled 2014-08-28 (×7): qty 1

## 2014-08-28 MED ORDER — OXYCODONE-ACETAMINOPHEN 5-325 MG PO TABS: 2.0000 | ORAL_TABLET | ORAL | Status: DC | PRN

## 2014-08-28 MED ORDER — TERBUTALINE SULFATE 1 MG/ML IJ SOLN
0.2500 mg | Freq: Once | INTRAMUSCULAR | Status: DC | PRN
Start: 2014-08-28 — End: 2014-08-28

## 2014-08-28 MED ORDER — OXYTOCIN BOLUS FROM INFUSION: 500.0000 mL | INTRAVENOUS | Status: DC

## 2014-08-28 MED ORDER — ACETAMINOPHEN 325 MG PO TABS: 650.0000 mg | ORAL_TABLET | ORAL | Status: DC | PRN

## 2014-08-28 MED ORDER — DIBUCAINE 1 % RE OINT: 1.0000 "application " | TOPICAL_OINTMENT | RECTAL | Status: DC | PRN

## 2014-08-28 MED ORDER — ONDANSETRON HCL 4 MG/2ML IJ SOLN: 4.0000 mg | INTRAMUSCULAR | Status: DC | PRN

## 2014-08-28 MED ORDER — LANOLIN HYDROUS EX OINT: TOPICAL_OINTMENT | CUTANEOUS | Status: DC | PRN

## 2014-08-28 MED ORDER — FERROUS SULFATE 325 (65 FE) MG PO TABS
325.0000 mg | ORAL_TABLET | Freq: Two times a day (BID) | ORAL | Status: DC
  Administered 2014-08-29 – 2014-08-30 (×3): 325 mg via ORAL
  Filled 2014-08-28 (×3): qty 1

## 2014-08-28 MED ORDER — WITCH HAZEL-GLYCERIN EX PADS: 1.0000 "application " | MEDICATED_PAD | CUTANEOUS | Status: DC | PRN

## 2014-08-28 MED ORDER — CITRIC ACID-SODIUM CITRATE 334-500 MG/5ML PO SOLN: 30.0000 mL | ORAL | Status: DC | PRN

## 2014-08-28 MED ORDER — LACTATED RINGERS IV SOLN
500.0000 mL | Freq: Once | INTRAVENOUS | Status: DC
Start: 2014-08-28 — End: 2014-08-28

## 2014-08-28 MED ORDER — BENZOCAINE-MENTHOL 20-0.5 % EX AERO
1.0000 "application " | INHALATION_SPRAY | CUTANEOUS | Status: DC | PRN
  Administered 2014-08-28 – 2014-08-30 (×2): 1 via TOPICAL
  Filled 2014-08-28 (×2): qty 56

## 2014-08-28 MED ORDER — PHENYLEPHRINE 40 MCG/ML (10ML) SYRINGE FOR IV PUSH (FOR BLOOD PRESSURE SUPPORT)
80.0000 ug | PREFILLED_SYRINGE | INTRAVENOUS | Status: DC | PRN
  Filled 2014-08-28: qty 10
  Filled 2014-08-28: qty 2

## 2014-08-28 MED ORDER — OXYCODONE-ACETAMINOPHEN 5-325 MG PO TABS: 1.0000 | ORAL_TABLET | ORAL | Status: DC | PRN

## 2014-08-28 MED ORDER — FENTANYL 2.5 MCG/ML BUPIVACAINE 1/10 % EPIDURAL INFUSION (WH - ANES)
14.0000 mL/h | INTRAMUSCULAR | Status: DC | PRN
  Administered 2014-08-28 (×2): 14 mL/h via EPIDURAL
  Filled 2014-08-28: qty 125

## 2014-08-28 MED ORDER — PRENATAL MULTIVITAMIN CH
1.0000 | ORAL_TABLET | Freq: Every day | ORAL | Status: DC
  Administered 2014-08-29: 1 via ORAL
  Filled 2014-08-28: qty 1

## 2014-08-28 NOTE — MAU Note (Signed)
Contractions started last night, have gotten closer and stronger. No problems with preg.  Was 2cm yesterday- small amt of bloody mucous, had membranes stripped.

## 2014-08-28 NOTE — Anesthesia Preprocedure Evaluation (Signed)

## 2014-08-28 NOTE — H&P (Signed)
Stacy Walker is a 33 y.o. female presenting for admission due to labor. Intact membrane. Hx LEEP Maternal Medical History:  Reason for admission: Contractions.   Contractions: Onset was 3-5 hours ago.   Frequency: irregular.    Fetal activity: Perceived fetal activity is normal.    Prenatal complications: no prenatal complications   OB History   Grav Para Term Preterm Abortions TAB SAB Ect Mult Living   0 1 0 1 0 0 1     Past Medical History  Diagnosis Date  . Abnormal Pap smear 2008    LEEP, repeat WNL  . Bacterial vaginosis   . Trichomonas     07/24/2013  . Anemia   . Vaginal Pap smear, abnormal     normal since LEEP   Past Surgical History  Procedure Laterality Date  . Leep    . Wisdom tooth extraction      X 2 teeth   Family History: family history is negative for Other and Hearing loss. Social History:  reports that she has quit smoking. She has never used smokeless tobacco. She reports that she does not drink alcohol or use illicit drugs.   Prenatal Transfer Tool  Maternal Diabetes: No Genetic Screening: Normal Maternal Ultrasounds/Referrals: Abnormal:  Findings:   Isolated choroid plexus cyst Fetal Ultrasounds or other Referrals:  None Maternal Substance Abuse:  No Significant Maternal Medications:  None Significant Maternal Lab Results:  Lab values include: Group B Strep negative Other Comments:  None  ROS neg  Dilation: 4 Effacement (%): 100 Station: -1 Exam by:: S Earl RNC Blood pressure 103/69, pulse 86, temperature 97.8 F (36.6 C), temperature source Oral, resp. rate 16, height  (1.626 m), weight 69.854 kg (154 lb), last menstrual period 12/01/2013, SpO2 99.00%. Exam Physical Exam  Constitutional: She is oriented to person, place, and time. She appears well-developed and well-nourished.  HENT:  Head: Atraumatic.  Neck: Neck supple.  Cardiovascular: Regular rhythm.   Respiratory: Breath sounds normal.  GI: Soft.   Musculoskeletal: Normal range of motion. She exhibits no edema.  Neurological: She is alert and oriented to person, place, and time.  Skin: Skin is dry.  Psychiatric: She has a normal mood and affect.    Prenatal labs: ABO, Rh:  O positive Antibody:  neg Rubella:  Immune RPR:   NR HBsAg:   neg HIV:   NR GBS:   neg  Assessment/Plan: Labor  Term gestation GBS cx neg P) admit routine labs. Epidural prn amniotomy prn   Stacy Walker A 08/28/2014, 1:38 PM

## 2014-08-28 NOTE — Anesthesia Procedure Notes (Signed)

## 2014-08-28 NOTE — Progress Notes (Signed)
Stacy Walker is a 33 y.o. G3P1011 at [redacted]w[redacted]d by ultrasound admitted for active labor  Subjective: Chief Complaint  Patient presents with  . Labor Eval  epidural comfortable  Objective: BP 109/66  Pulse 78  Temp(Src) 97.8 F (36.6 C) (Oral)  Resp 16  Ht  (1.626 m)  Wt 69.854 kg (154 lb)  BMI 26.42 kg/m2  SpO2 100%  LMP 12/01/2013      FHT:  FHR: 140 bpm, variability: moderate,  accelerations:  Present,  decelerations:  Absent UC: ctx q 2- 4 mins SVE:   4 cm dilated, 100% effaced, 0 station, presenting part vtx left asynclitic. Arom..clear fluid. leep scar tissue Tracing: cat 1.  Labs: Lab Results  Component Value Date   WBC 11.0* 08/28/2014   HGB 11.6* 08/28/2014   HCT 36.5 08/28/2014   MCV 72.6* 08/28/2014   PLT 134* 08/28/2014    Assessment / Plan: Arrest in active phase of labor Term gestation Gestational thrombocytopenia P ) amniotomy. Pitocin augmentation right exaggerated sims positi  Anticipated MOD:  NSVD  Stacy Walker A 08/28/2014, 5:50 PM

## 2014-08-29 ENCOUNTER — Encounter (HOSPITAL_COMMUNITY): Payer: Self-pay

## 2014-08-29 LAB — CBC
HEMATOCRIT: 35.4 % — AB (ref 36.0–46.0)
HEMOGLOBIN: 11.5 g/dL — AB (ref 12.0–15.0)
MCH: 23.6 pg — ABNORMAL LOW (ref 26.0–34.0)
MCHC: 32.5 g/dL (ref 30.0–36.0)
MCV: 72.7 fL — ABNORMAL LOW (ref 78.0–100.0)
Platelets: 139 10*3/uL — ABNORMAL LOW (ref 150–400)
RBC: 4.87 MIL/uL (ref 3.87–5.11)
RDW: 15.1 % (ref 11.5–15.5)
WBC: 16.6 10*3/uL — ABNORMAL HIGH (ref 4.0–10.5)

## 2014-08-29 NOTE — Progress Notes (Signed)
Patient ID: Stacy Walker, female   DOB: March 06, 1981, 33 y.o.   MRN: [id number] PPD # 1 SVD  S:  Reports feeling well             Tolerating po/ No nausea or vomiting             Bleeding is light             Pain controlled with ibuprofen (OTC)             Up ad lib / ambulatory / voiding without difficulties    Newborn  Information for the patient's newborn:  [name], [place] [[id number]]  female  breast feeding  / Circumcision done   O:  A & O x 3, in no apparent distress              VS:  Filed Vitals:   08/28/14 2031 08/28/14 2100 08/28/14 2200 08/29/14 0210  BP: 98/72 119/72 123/66 101/52  Pulse: 68 66 65 85  Temp:  99 F (37.2 C) 98.1 F (36.7 C) 98.4 F (36.9 C)  TempSrc:  Oral Oral Oral  Resp: Height:      Weight:      SpO2:        LABS:  Recent Labs  08/28/14 1150 08/29/14 0600  WBC 11.0* 16.6*  HGB 11.6* 11.5*  HCT 36.5 35.4*  PLT 134* 139*    Blood type: --/--/O POS (09/23 1150)  Rubella: Immune (02/25 0000)   I&O: I/O last 3 completed shifts: In: -  Out: 550 [Urine:300; Blood:250]             Lungs: Clear and unlabored  Heart: regular rate and rhythm / no murmurs  Abdomen: soft, non-tender, non-distended              Fundus: firm, non-tender, U-2  Perineum: 1st degree repair healing well, no edema  Lochia: minimal  Extremities: no edema, no calf pain or tenderness, no Homans, TED hose in place    A/P: PPD # 1  33 y.o., [id number]   Principal Problem:    Postpartum care following vaginal delivery (9/23)    Doing well - stable status  Routine post partum orders  Anticipate discharge tomorrow    Raelyn Mora, M, MSN, CNM 08/29/2014, 10:43 AM

## 2014-08-29 NOTE — Lactation Note (Signed)
This note was copied from the chart of Stacy Anagabriela Bizzarro. Lactation Consultation Note BF 1 child for 3 months, stated her milk started drying up and had to start bottle feeding her first child. Mom states this baby is latching on well and having long feedings. States sometimes may have a shallow latch. Discussed shallow verses deep latch. Heard audible swallows. Comfort gels given for soreness. Hand expression taught w/noted colostrum, encouraged to rub on nipples for soreness. Mom encouraged to feed baby 8-12 times/24 hours and with feeding cues.  Educated about newborn behavior. Mom encouraged to do skin-to-skin.Mom encouraged to waken baby for feeds. WH/LC brochure given w/resources, support groups and LC services.Referred to Baby and Me Book in Breastfeeding section Pg. 22-23 for position options and Proper latch demonstration. Patient Name: Stacy Walker [id number] Date: 08/29/2014 Reason for consult: Initial assessment   Maternal Data Has patient been taught Hand Expression?: Yes Does the patient have breastfeeding experience prior to this delivery?: Yes  Feeding Feeding Type: Breast Fed Length of feed: 15 min  LATCH Score/Interventions Latch: Grasps breast easily, tongue down, lips flanged, rhythmical sucking.  Audible Swallowing: A few with stimulation Intervention(s): Hand expression  Type of Nipple: Everted at rest and after stimulation  Comfort (Breast/Nipple): Filling, red/small blisters or bruises, mild/mod discomfort  Problem noted: Mild/Moderate discomfort Interventions (Mild/moderate discomfort): Hand expression;Hand massage;Comfort gels  Hold (Positioning): No assistance needed to correctly position infant at breast.  LATCH Score: 8  Lactation Tools Discussed/Used Tools: Comfort gels   Consult Status Consult Status: Follow-up Date: 08/29/14 Follow-up type: In-patient    Charyl Dancer 08/29/2014, 6:24 AM

## 2014-08-29 NOTE — Anesthesia Postprocedure Evaluation (Addendum)
  Anesthesia Post-op Note  Patient: Stacy Walker  Procedure(s) Performed: * No procedures listed *  Patient Location: Mother/ Baby  Anesthesia Type:Epidural  Level of Consciousness: awake, alert , oriented and patient cooperative  Airway and Oxygen Therapy: Patient Spontanous Breathing  Post-op Pain: mild  Post-op Assessment: Post-op Vital signs reviewed, Patient's Cardiovascular Status Stable, Respiratory Function Stable, Patent Airway, No headache, No backache, No residual numbness and No residual motor weakness  Post-op Vital Signs: Reviewed and stable  Last Vitals:  Filed Vitals:   08/29/14 0210  BP: 101/52  Pulse: 85  Temp: 36.9 C  Resp: 16    Complications: No apparent anesthesia complications

## 2014-08-30 MED ORDER — IBUPROFEN 600 MG PO TABS: 600.0000 mg | ORAL_TABLET | Freq: Four times a day (QID) | ORAL | Status: DC

## 2014-08-30 MED ORDER — OXYCODONE-ACETAMINOPHEN 5-325 MG PO TABS: 1.0000 | ORAL_TABLET | ORAL | Status: DC | PRN

## 2014-08-30 NOTE — Lactation Note (Signed)
This note was copied from the chart of [name]. Lactation Consultation Note  Patient Name: [name] [id number] Date: 08/30/2014 Reason for consult: Follow-up assessment  Baby 39 hours of life. Mom reports baby latching and nursing well. Mom states that she is seeing colostrum and believes her breasts are starting to fill. Mom states that she is hearing baby swallow while nursing. Mom states that she has a little nipple soreness, but no damage to nipples. Give CGs. Enc mom to feed with cues. Discussed engorgement prevention/treatment. Referred mom to Baby and Me handbook for number of diapers to expect by day of life and EBM storage guidelines. Mom aware of OP/BFSG and LC phone assistance.  Maternal Data    Feeding Feeding Type:  (Per mom, baby nursed within last hour and is cluster feed, and nursing well. ) Length of feed: 15 min  LATCH Score/Interventions Latch: Repeated attempts needed to sustain latch, nipple held in mouth throughout feeding, stimulation needed to elicit sucking reflex.  Audible Swallowing: A few with stimulation  Type of Nipple: Everted at rest and after stimulation  Comfort (Breast/Nipple): Soft / non-tender  Interventions (Mild/moderate discomfort): Comfort gels  Hold (Positioning): Assistance needed to correctly position infant at breast and maintain latch.  LATCH Score: 7  Lactation Tools Discussed/Used     Consult Status Consult Status: Complete    Daquawn Seelman 08/30/2014, 11:00 AM

## 2014-08-30 NOTE — Discharge Summary (Signed)
Obstetric Discharge Summary Reason for Admission: onset of labor Prenatal Procedures: ultrasound Intrapartum Procedures: spontaneous vaginal delivery Postpartum Procedures: none Complications-Operative and Postpartum: 1st degree perineal laceration Hemoglobin  Date Value Ref Range Status  08/29/2014 11.5* 12.0 - 15.0 g/dL Final     HCT  Date Value Ref Range Status  08/29/2014 35.4* 36.0 - 46.0 % Final    Physical Exam:  General: alert and cooperative Lochia: appropriate Uterine Fundus: firm Incision: healing well, no significant drainage, no dehiscence, no significant erythema DVT Evaluation: No evidence of DVT seen on physical exam. Negative Homan's sign. No cords or calf tenderness. No significant calf/ankle edema.  Discharge Diagnoses: Term Pregnancy-delivered  Discharge Information: Date: 08/30/2014 Activity: pelvic rest Diet: routine Medications: PNV, Ibuprofen and Percocet Condition: stable Instructions: refer to practice specific booklet Discharge to: home Follow-up Information   Follow up with COUSINS,SHERONETTE A, MD. Schedule an appointment as soon as possible for a visit in 6 weeks.   Specialty:  Obstetrics and Gynecology   Contact information:   [address] [place] [address] [phone]       Newborn Data: Live born female on 08/28/14 Birth Weight: 7 lb 7 oz (3374 g) APGAR: 8, 9  Home with mother.  Adda Stokes, N 08/30/2014, 10:27 AM

## 2014-08-30 NOTE — Progress Notes (Signed)
PPD #2- SVD  Subjective:   Reports feeling well, ready for discharge Tolerating po/ No nausea or vomiting Bleeding is light Pain controlled with Motrin and Percocet Up ad lib / ambulatory / voiding without problems Newborn: breastfeeding  / Circumcision: done   Objective:   VS: VS:  Filed Vitals:   08/28/14 2200 08/29/14 0210 08/29/14 1015 08/29/14 1734  BP: 123/66 101/52 104/57 104/66  Pulse: 65 85 84 77  Temp: 98.1 F (36.7 C) 98.4 F (36.9 C) 98.6 F (37 C) 98.5 F (36.9 C)  TempSrc: Oral Oral Oral Oral  Resp: Height:      Weight:      SpO2:   100%     LABS:  Recent Labs  08/28/14 1150 08/29/14 0600  WBC 11.0* 16.6*  HGB 11.6* 11.5*  PLT 134* 139*   Blood type: --/--/O POS (09/23 1150) Rubella: Immune (02/25 0000)                I&O: Intake/Output     09/24 0701 - 09/25 0700 09/25 0701 - 09/26 0700   Urine (mL/kg/hr)     Blood     Total Output       Net              Physical Exam: Alert and oriented X3 Abdomen: soft, non-tender, non-distended  Fundus: firm, non-tender, U-2 Perineum: Well approximated, no significant erythema, edema, or drainage; healing well. Lochia: small Extremities: No edema, no calf pain or tenderness    Assessment: PPD # 2 G3P2012/ S/P:spontaneous vaginal, 1st degree laceration Doing well - stable for discharge home   Plan: Discharge home RX's:  Ibuprofen  po Q 6 hrs prn pain #30 Refill x 0 Percocet 5/325 1 to 2 po Q 4 hrs prn pain #30 Refill x 0 Routine pp visit in [employer] booklet given    Donette Larry, N MSN, CNM 08/30/2014, 9:36 AM

## 2014-10-07 ENCOUNTER — Encounter (HOSPITAL_COMMUNITY): Payer: Self-pay

## 2015-03-25 ENCOUNTER — Emergency Department (HOSPITAL_COMMUNITY)
Admission: EM | Admit: 2015-03-25 | Discharge: 2015-03-25 | Disposition: A | Discharge status: Home / Self Care | Payer: BC Managed Care – PPO | Source: Home / Self Care | Attending: Emergency Medicine | Admitting: Emergency Medicine

## 2015-03-25 ENCOUNTER — Encounter (HOSPITAL_COMMUNITY): Payer: Self-pay | Admitting: Emergency Medicine

## 2015-03-25 DIAGNOSIS — J111 Influenza due to unidentified influenza virus with other respiratory manifestations: Secondary | ICD-10-CM | POA: Diagnosis not present

## 2015-03-25 MED ORDER — OSELTAMIVIR PHOSPHATE 75 MG PO CAPS: 75.0000 mg | ORAL_CAPSULE | Freq: Two times a day (BID) | ORAL | Status: DC

## 2015-03-25 NOTE — ED Notes (Signed)
C/o  Fever.  Headache.  Sore throat.   States took ibuprofen this a.m for fever.  Denies n/v/d.  Symptoms on set this a.m.

## 2015-03-25 NOTE — Discharge Instructions (Signed)
You likely have the flu. Take Tamiflu twice a day for 5 days. Take Tylenol for ibuprofen as needed for fever and body aches. Get plenty of rest and drink plenty of fluids. You should start to feel better in 3-5 days.

## 2015-03-25 NOTE — ED Provider Notes (Signed)
CSN: [id number]     Arrival date & time 03/25/15  1209 History   First MD Initiated Contact with Patient 03/25/15 1353     Chief Complaint  Patient presents with  . Influenza   (Consider location/radiation/quality/duration/timing/severity/associated sxs/prior Treatment) HPI  She is a 34 year old woman here for evaluation of fever. Her symptoms started this morning with a sore throat. She went to work, but describes chills. When she got home she took a temperature and it was 101.6. She also reports headache and body aches. She has a mild cough and some mild nasal congestion. No nausea or vomiting. Her appetite is decreased but she is tolerating fluids.  Past Medical History  Diagnosis Date  . Abnormal Pap smear 2008    LEEP, repeat WNL  . Bacterial vaginosis   . Trichomonas     07/24/2013  . Anemia   . Vaginal Pap smear, abnormal     normal since LEEP  . Postpartum care following vaginal delivery (9/23) 08/28/2014   Past Surgical History  Procedure Laterality Date  . Leep    . Wisdom tooth extraction      X 2 teeth   Family History  Problem Relation Age of Onset  . Other Neg Hx   . Hearing loss Neg Hx    History  Substance Use Topics  . Smoking status: Former Games developermoker  . Smokeless tobacco: Never Used     Comment: occ smoker in HS, not sice  . Alcohol Use: No   OB History    Gravida Para Term Preterm AB TAB SAB Ectopic Multiple Living   3 2 2  0 1 0 1 0 0 2     Review of Systems  Constitutional: Positive for fever, chills and appetite change.  HENT: Positive for congestion and sore throat. Negative for trouble swallowing.   Respiratory: Positive for cough. Negative for shortness of breath.   Gastrointestinal: Negative for nausea and vomiting.  Musculoskeletal: Positive for myalgias.  Neurological: Positive for headaches.    Allergies  Review of patient's allergies indicates no known allergies.  Home Medications   Prior to Admission medications   Medication Sig  Start Date End Date Taking? Authorizing Provider  ibuprofen (ADVIL,MOTRIN) 600 MG tablet Take 1 tablet (600 mg total) by mouth every 6 (six) hours. 08/30/14   Lawernce PittsMelanie N Bhambri, CNM  oseltamivir (TAMIFLU) 75 MG capsule Take 1 capsule (75 mg total) by mouth every 12 (twelve) hours. 03/25/15   Charm RingsErin J Terre Hanneman, MD  oxyCODONE-acetaminophen (PERCOCET/ROXICET) 5-325 MG per tablet Take 1-2 tablets by mouth every 4 (four) hours as needed (for pain scale equal to or greater than 7). 08/30/14   Lawernce PittsMelanie N Bhambri, CNM  Prenatal Vit-Fe Fumarate-FA (PRENATAL MULTIVITAMIN) TABS tablet Take 1 tablet by mouth daily at 12 noon.    Historical Provider, MD   BP 95/65 mmHg  Pulse 67  Temp(Src) 98.1 F (36.7 C) (Oral)  Resp 12  SpO2 100% Physical Exam  Constitutional: She is oriented to person, place, and time. She appears well-developed and well-nourished.  HENT:  Right Ear: External ear normal.  Left Ear: External ear normal.  Nose: Nose normal.  Mouth/Throat: No oropharyngeal exudate.  Mild pharyngeal erythema.  Eyes: Conjunctivae are normal.  Neck: Neck supple.  Cardiovascular: Normal rate, regular rhythm and normal heart sounds.   No murmur heard. Pulmonary/Chest: Effort normal and breath sounds normal. No respiratory distress. She has no wheezes. She has no rales.  Neurological: She is alert and oriented to person, place,  and time.    ED Course  Procedures (including critical care time) Labs Review Labs Reviewed - No data to display  Imaging Review No results found.   MDM   1. Influenza    We'll treat with Tamiflu. Recommended Tylenol or ibuprofen as needed for fever and body aches. Plenty of rest and plenty of fluids. Follow-up as needed.    Charm Rings, MD 03/25/15 [id number]

## 2017-09-13 ENCOUNTER — Ambulatory Visit (HOSPITAL_COMMUNITY)
Admission: EM | Admit: 2017-09-13 | Discharge: 2017-09-13 | Disposition: A | Discharge status: Home / Self Care | Payer: BC Managed Care – PPO | Attending: Family Medicine | Admitting: Family Medicine

## 2017-09-13 ENCOUNTER — Encounter (HOSPITAL_COMMUNITY): Payer: Self-pay | Admitting: Emergency Medicine

## 2017-09-13 DIAGNOSIS — J069 Acute upper respiratory infection, unspecified: Secondary | ICD-10-CM | POA: Diagnosis not present

## 2017-09-13 MED ORDER — PREDNISONE 50 MG PO TABS: ORAL_TABLET | ORAL | 0 refills | Status: DC

## 2017-09-13 NOTE — ED Triage Notes (Signed)
Pt c/o cold sx onset 3 days associated w/HA, facial pressure, prod cough  Denies fevers  Taking OTC cold meds  A&O x4... NAD... Ambulatory

## 2017-09-13 NOTE — ED Provider Notes (Signed)
[facility]    CSN: [id number] Arrival date & time: 09/13/17  4098     History   Chief Complaint Chief Complaint  Patient presents with  . URI    HPI Keyanah L Lich is a 36 y.o. female.   36 year old female is accompanied by husband and son with same symptoms. Facial pressure, headache, runny nose, PND, cough. Started 2 days ago. No fever or chills. She has taken Tylenol Sinus medication with no cure. Temp 98.1 currently.      Past Medical History:  Diagnosis Date  . Abnormal Pap smear 2008   LEEP, repeat WNL  . Anemia   . Bacterial vaginosis   . Postpartum care following vaginal delivery (9/23) 08/28/2014  . Trichomonas    07/24/2013  . Vaginal Pap smear, abnormal    normal since LEEP    Patient Active Problem List   Diagnosis Date Noted  . Active labor at term 08/28/2014  . Postpartum care following vaginal delivery (9/23) 08/28/2014    Past Surgical History:  Procedure Laterality Date  . LEEP    . WISDOM TOOTH EXTRACTION     X 2 teeth    OB History    Gravida Para Term Preterm AB Living   0 1 2   SAB TAB Ectopic Multiple Live Births   1 0 0 0 2       Home Medications    Prior to Admission medications   Medication Sig Start Date End Date Taking? Authorizing Provider  ibuprofen (ADVIL,MOTRIN) 600 MG tablet Take 1 tablet (600 mg total) by mouth every 6 (six) hours. 08/30/14   Donette Larry, CNM  predniSONE (DELTASONE) 50 MG tablet 1 tab po daily for 6 days. Take with food. 09/13/17   Hayden Rasmussen, NP  Prenatal Vit-Fe Fumarate-FA (PRENATAL MULTIVITAMIN) TABS tablet Take 1 tablet by mouth daily at 12 noon.    [provider]    Family History Family History  Problem Relation Age of Onset  . Other Neg Hx   . Hearing loss Neg Hx     Social History Social History  Substance Use Topics  . Smoking status: Former Games developer  . Smokeless tobacco: Never Used     Comment: occ smoker in HS, not sice  . Alcohol use No      Allergies   Patient has no known allergies.   Review of Systems Review of Systems  Constitutional: Negative for activity change, appetite change, chills, fatigue and fever.  HENT: Positive for congestion, postnasal drip and rhinorrhea. Negative for facial swelling.   Eyes: Negative.   Respiratory: Negative.   Cardiovascular: Negative.   Musculoskeletal: Negative for neck pain and neck stiffness.  Skin: Negative for pallor and rash.  Neurological: Negative.   All other systems reviewed and are negative.    Physical Exam Triage Vital Signs ED Triage Vitals [09/13/17 1020]  Enc Vitals Group     BP 100/75     Pulse Rate 78     Resp 20     Temp 98.1 F (36.7 C)     Temp Source Oral     SpO2 99 %     Weight      Height      Head Circumference      Peak Flow      Pain Score      Pain Loc      Pain Edu?      Excl. in GC?    No  data found.   Updated Vital Signs BP 100/75 (BP Location: Left Arm)   Pulse 78   Temp 98.1 F (36.7 C) (Oral)   Resp 20   LMP 09/13/2017   SpO2 99%   Breastfeeding? No   Visual Acuity Right Eye Distance:   Left Eye Distance:   Bilateral Distance:    Right Eye Near:   Left Eye Near:    Bilateral Near:     Physical Exam  Constitutional: She is oriented to person, place, and time. She appears well-developed and well-nourished. No distress.  HENT:  Mouth/Throat: No oropharyngeal exudate.  Bilateral TMs are normal. Oropharynx with minor injection moderate amount of clear PND  Eyes: EOM are normal.  Neck: Normal range of motion. Neck supple.  Cardiovascular: Normal rate and regular rhythm.   Pulmonary/Chest: Effort normal and breath sounds normal. No respiratory distress.  Musculoskeletal: Normal range of motion. She exhibits no edema.  Lymphadenopathy:    She has no cervical adenopathy.  Neurological: She is alert and oriented to person, place, and time.  Skin: Skin is warm and dry. No rash noted.  Psychiatric: She has a  normal mood and affect.  Nursing note and vitals reviewed.    UC Treatments / Results  Labs (all labs ordered are listed, but only abnormal results are displayed) Labs Reviewed - No data to display  EKG  EKG Interpretation None       Radiology No results found.  Procedures Procedures (including critical care time)  Medications Ordered in UC Medications - No data to display   Initial Impression / Assessment and Plan / UC Course  I have reviewed the triage vital signs and the nursing notes.  Pertinent labs & imaging results that were available during my care of the patient were reviewed by me and considered in my medical decision making (see chart for details).    Sudafed PE 10 mg every 4 to 6 hours as needed for congestion Allegra or Zyrtec daily as needed for drainage and runny nose. For stronger antihistamine may take Chlor-Trimeton 2 to 4 mg every 4 to 6 hours, may cause drowsiness.S Saline nasal spray used frequently. Drink plenty of fluids and stay well-hydrated. Flonase or Rhinocort nasal spray daily     Final Clinical Impressions(s) / UC Diagnoses   Final diagnoses:  Acute upper respiratory infection    New Prescriptions New Prescriptions   PREDNISONE (DELTASONE) 50 MG TABLET    1 tab po daily for 6 days. Take with food.     Controlled Substance Prescriptions [place] Controlled Substance Registry consulted? Not Applicable   Hayden Rasmussen, NP 09/13/17 1113

## 2017-09-13 NOTE — Discharge Instructions (Signed)
Sudafed PE 10 mg every 4 to 6 hours as needed for congestion °Allegra or Zyrtec daily as needed for drainage and runny nose. °For stronger antihistamine may take Chlor-Trimeton 2 to 4 mg every 4 to 6 hours, may cause drowsiness.S °Saline nasal spray used frequently. °Drink plenty of fluids and stay well-hydrated. °Flonase or Rhinocort nasal spray daily °

## 2017-10-18 ENCOUNTER — Other Ambulatory Visit: Payer: Self-pay | Admitting: Obstetrics and Gynecology

## 2017-10-24 NOTE — Patient Instructions (Addendum)
Your procedure is scheduled on: Friday November 04, 2017 at 2:30 pm   [facility] at: 1:00 pm  Pick up the phone at the desk and dial [phone].  Call this number if you have problems the morning of surgery: [phone].  Remember: Do NOT eat food: after Midnight on Thursday November 29  Do NOT drink clear liquids after: 8:30 am day of surgery Take these medicines the morning of surgery with a SIP OF WATER:  None  Do NOT wear jewelry (body piercing), metal hair clips/bobby pins, make-up, artificial eye lashes or nail polish. Do NOT wear lotions, powders, or perfumes.  You may wear deoderant. Do NOT shave for 48 hours prior to surgery. Do NOT bring valuables to the hospital. Contacts, dentures, or bridgework may not be worn into surgery.  Have a responsible adult drive you home and stay with you for 24 hours after your procedure

## 2017-10-26 ENCOUNTER — Encounter (HOSPITAL_COMMUNITY): Payer: Self-pay

## 2017-10-26 ENCOUNTER — Other Ambulatory Visit: Payer: Self-pay

## 2017-10-26 ENCOUNTER — Encounter (HOSPITAL_COMMUNITY)
Admission: RE | Admit: 2017-10-26 | Discharge: 2017-10-26 | Disposition: A | Discharge status: Home / Self Care | Payer: BC Managed Care – PPO | Source: Ambulatory Visit | Attending: Obstetrics and Gynecology | Admitting: Obstetrics and Gynecology

## 2017-10-26 DIAGNOSIS — Z01812 Encounter for preprocedural laboratory examination: Secondary | ICD-10-CM | POA: Diagnosis present

## 2017-10-26 LAB — CBC
HCT: 39 % (ref 36.0–46.0)
HEMOGLOBIN: 11.8 g/dL — AB (ref 12.0–15.0)
MCH: 21.9 pg — AB (ref 26.0–34.0)
MCHC: 30.3 g/dL (ref 30.0–36.0)
MCV: 72.5 fL — AB (ref 78.0–100.0)
PLATELETS: 210 10*3/uL (ref 150–400)
RBC: 5.38 MIL/uL — AB (ref 3.87–5.11)
RDW: 15.3 % (ref 11.5–15.5)
WBC: 4.1 10*3/uL (ref 4.0–10.5)

## 2017-11-04 ENCOUNTER — Ambulatory Visit (HOSPITAL_COMMUNITY): Payer: BC Managed Care – PPO | Admitting: Certified Registered Nurse Anesthetist

## 2017-11-04 ENCOUNTER — Encounter (HOSPITAL_COMMUNITY)
Admission: RE | Disposition: A | Discharge status: Home / Self Care | Payer: Self-pay | Source: Ambulatory Visit | Attending: Obstetrics and Gynecology

## 2017-11-04 ENCOUNTER — Encounter (HOSPITAL_COMMUNITY): Payer: Self-pay | Admitting: Emergency Medicine

## 2017-11-04 ENCOUNTER — Ambulatory Visit (HOSPITAL_COMMUNITY)
Admission: RE | Admit: 2017-11-04 | Discharge: 2017-11-04 | Disposition: A | Discharge status: Home / Self Care | Payer: BC Managed Care – PPO | Source: Ambulatory Visit | Attending: Obstetrics and Gynecology | Admitting: Obstetrics and Gynecology

## 2017-11-04 DIAGNOSIS — Z87891 Personal history of nicotine dependence: Secondary | ICD-10-CM | POA: Diagnosis not present

## 2017-11-04 DIAGNOSIS — Z302 Encounter for sterilization: Secondary | ICD-10-CM | POA: Diagnosis not present

## 2017-11-04 HISTORY — PX: LAPAROSCOPIC TUBAL LIGATION: SHX1937

## 2017-11-04 LAB — PREGNANCY, URINE: PREG TEST UR: NEGATIVE

## 2017-11-04 SURGERY — LIGATION, FALLOPIAN TUBE, LAPAROSCOPIC
Anesthesia: General | Site: Abdomen | Laterality: Bilateral

## 2017-11-04 MED ORDER — KETOROLAC TROMETHAMINE 30 MG/ML IJ SOLN
INTRAMUSCULAR | Status: AC
  Filled 2017-11-04: qty 1

## 2017-11-04 MED ORDER — BUPIVACAINE HCL (PF) 0.25 % IJ SOLN
INTRAMUSCULAR | Status: AC
  Filled 2017-11-04: qty 30

## 2017-11-04 MED ORDER — SCOPOLAMINE 1 MG/3DAYS TD PT72
1.0000 | MEDICATED_PATCH | Freq: Once | TRANSDERMAL | Status: DC
  Administered 2017-11-04: 1.5 mg via TRANSDERMAL

## 2017-11-04 MED ORDER — SCOPOLAMINE 1 MG/3DAYS TD PT72
MEDICATED_PATCH | TRANSDERMAL | Status: AC
  Administered 2017-11-04: 1.5 mg via TRANSDERMAL
  Filled 2017-11-04: qty 1

## 2017-11-04 MED ORDER — OXYCODONE HCL 5 MG PO TABS: 5.0000 mg | ORAL_TABLET | Freq: Once | ORAL | Status: DC | PRN

## 2017-11-04 MED ORDER — DEXAMETHASONE SODIUM PHOSPHATE 4 MG/ML IJ SOLN
INTRAMUSCULAR | Status: AC
  Filled 2017-11-04: qty 1

## 2017-11-04 MED ORDER — ONDANSETRON HCL 4 MG/2ML IJ SOLN
INTRAMUSCULAR | Status: AC
  Filled 2017-11-04: qty 2

## 2017-11-04 MED ORDER — SUGAMMADEX SODIUM 200 MG/2ML IV SOLN
INTRAVENOUS | Status: DC | PRN
  Administered 2017-11-04: 120 mg via INTRAVENOUS

## 2017-11-04 MED ORDER — SODIUM CHLORIDE 0.9 % IJ SOLN
INTRAMUSCULAR | Status: AC
  Filled 2017-11-04: qty 10

## 2017-11-04 MED ORDER — ROCURONIUM BROMIDE 100 MG/10ML IV SOLN
INTRAVENOUS | Status: DC | PRN
  Administered 2017-11-04: 30 mg via INTRAVENOUS

## 2017-11-04 MED ORDER — OXYCODONE HCL 5 MG/5ML PO SOLN: 5.0000 mg | Freq: Once | ORAL | Status: DC | PRN

## 2017-11-04 MED ORDER — MIDAZOLAM HCL 2 MG/2ML IJ SOLN
INTRAMUSCULAR | Status: AC
  Filled 2017-11-04: qty 2

## 2017-11-04 MED ORDER — FENTANYL CITRATE (PF) 100 MCG/2ML IJ SOLN: 25.0000 ug | INTRAMUSCULAR | Status: DC | PRN

## 2017-11-04 MED ORDER — LIDOCAINE HCL (CARDIAC) 20 MG/ML IV SOLN
INTRAVENOUS | Status: DC | PRN
  Administered 2017-11-04: 50 mg via INTRAVENOUS

## 2017-11-04 MED ORDER — PROPOFOL 10 MG/ML IV BOLUS
INTRAVENOUS | Status: DC | PRN
  Administered 2017-11-04: 160 mg via INTRAVENOUS

## 2017-11-04 MED ORDER — DEXAMETHASONE SODIUM PHOSPHATE 10 MG/ML IJ SOLN
INTRAMUSCULAR | Status: DC | PRN
  Administered 2017-11-04: 4 mg via INTRAVENOUS

## 2017-11-04 MED ORDER — ONDANSETRON HCL 4 MG/2ML IJ SOLN
INTRAMUSCULAR | Status: DC | PRN
  Administered 2017-11-04: 4 mg via INTRAVENOUS

## 2017-11-04 MED ORDER — SODIUM CHLORIDE 0.9 % IJ SOLN
INTRAMUSCULAR | Status: DC | PRN
  Administered 2017-11-04: 10 mL

## 2017-11-04 MED ORDER — LACTATED RINGERS IV SOLN
INTRAVENOUS | Status: DC
  Administered 2017-11-04 (×2): via INTRAVENOUS

## 2017-11-04 MED ORDER — HYDROCODONE-ACETAMINOPHEN 5-325 MG PO TABS: 1.0000 | ORAL_TABLET | Freq: Four times a day (QID) | ORAL | 0 refills | Status: DC | PRN

## 2017-11-04 MED ORDER — LIDOCAINE HCL (CARDIAC) 20 MG/ML IV SOLN
INTRAVENOUS | Status: AC
  Filled 2017-11-04: qty 5

## 2017-11-04 MED ORDER — ONDANSETRON HCL 4 MG/2ML IJ SOLN: 4.0000 mg | Freq: Four times a day (QID) | INTRAMUSCULAR | Status: DC | PRN

## 2017-11-04 MED ORDER — BUPIVACAINE HCL (PF) 0.25 % IJ SOLN
INTRAMUSCULAR | Status: DC | PRN
  Administered 2017-11-04: 7 mL

## 2017-11-04 MED ORDER — KETOROLAC TROMETHAMINE 30 MG/ML IJ SOLN
INTRAMUSCULAR | Status: DC | PRN
  Administered 2017-11-04: 30 mg via INTRAVENOUS
  Administered 2017-11-04: 30 mg via INTRAMUSCULAR

## 2017-11-04 MED ORDER — PROPOFOL 10 MG/ML IV BOLUS
INTRAVENOUS | Status: AC
  Filled 2017-11-04: qty 20

## 2017-11-04 MED ORDER — MIDAZOLAM HCL 2 MG/2ML IJ SOLN
INTRAMUSCULAR | Status: DC | PRN
  Administered 2017-11-04: 2 mg via INTRAVENOUS

## 2017-11-04 MED ORDER — ROCURONIUM BROMIDE 100 MG/10ML IV SOLN
INTRAVENOUS | Status: AC
  Filled 2017-11-04: qty 1

## 2017-11-04 MED ORDER — FENTANYL CITRATE (PF) 100 MCG/2ML IJ SOLN
INTRAMUSCULAR | Status: DC | PRN
  Administered 2017-11-04 (×3): 50 ug via INTRAVENOUS

## 2017-11-04 MED ORDER — FENTANYL CITRATE (PF) 250 MCG/5ML IJ SOLN
INTRAMUSCULAR | Status: AC
  Filled 2017-11-04: qty 5

## 2017-11-04 SURGICAL SUPPLY — 20 items
CATH ROBINSON RED A/P 16FR (CATHETERS) ×2 IMPLANT
DRSG OPSITE POSTOP 3X4 (GAUZE/BANDAGES/DRESSINGS) ×2 IMPLANT
DURAPREP 26ML APPLICATOR (WOUND CARE) ×2 IMPLANT
GLOVE BIOGEL PI IND STRL 7.0 (GLOVE) ×2 IMPLANT
GLOVE BIOGEL PI INDICATOR 7.0 (GLOVE) ×2
GLOVE ECLIPSE 6.5 STRL STRAW (GLOVE) ×2 IMPLANT
GOWN STRL REUS W/TWL LRG LVL3 (GOWN DISPOSABLE) ×4 IMPLANT
NEEDLE INSUFFLATION 120MM (ENDOMECHANICALS) ×2 IMPLANT
PACK LAPAROSCOPY BASIN (CUSTOM PROCEDURE TRAY) ×2 IMPLANT
PACK TRENDGUARD 450 HYBRID PRO (MISCELLANEOUS) ×1 IMPLANT
PACK TRENDGUARD 600 HYBRD PROC (MISCELLANEOUS) IMPLANT
PROTECTOR NERVE ULNAR (MISCELLANEOUS) ×3 IMPLANT
SLEEVE XCEL OPT CAN 5 100 (ENDOMECHANICALS) ×2 IMPLANT
SUT VICRYL 0 UR6 27IN ABS (SUTURE) ×2 IMPLANT
SUT VICRYL 4-0 PS2 18IN ABS (SUTURE) ×2 IMPLANT
TOWEL OR 17X24 6PK STRL BLUE (TOWEL DISPOSABLE) ×4 IMPLANT
TRENDGUARD 450 HYBRID PRO PACK (MISCELLANEOUS) ×2
TRENDGUARD 600 HYBRID PROC PK (MISCELLANEOUS)
TROCAR OPTI TIP 5M 100M (ENDOMECHANICALS) ×2 IMPLANT
TROCAR XCEL DIL TIP R 11M (ENDOMECHANICALS) ×2 IMPLANT

## 2017-11-04 NOTE — H&P (Signed)
Stacy Walker is an 36 y.o. female G3P2012 MBF presents for permanent sterilization.   Pertinent Gynecological History: Menses: flow is moderate Bleeding: reg Contraception: none DES exposure: denies Blood transfusions: none Sexually transmitted diseases: no past history Previous GYN Procedures: DNC  Last mammogram: n/a Date: n/a Last pap: normal Date:2018 OB History: G3, P2   Menstrual History: Menarche age: n/a No LMP recorded.    Past Medical History:  Diagnosis Date  . Abnormal Pap smear 2008   LEEP, repeat WNL  . Anemia    during pregnancy  . Bacterial vaginosis   . Postpartum care following vaginal delivery (9/23) 08/28/2014  . Trichomonas    07/24/2013  . Vaginal Pap smear, abnormal    normal since LEEP    Past Surgical History:  Procedure Laterality Date  . LEEP    . WISDOM TOOTH EXTRACTION     X 2 teeth    Family History  Problem Relation Age of Onset  . Other Neg Hx   . Hearing loss Neg Hx     Social History:  reports that she has quit smoking. she has never used smokeless tobacco. She reports that she does not drink alcohol or use drugs.  Allergies: No Known Allergies  No medications prior to admission.    Review of Systems  All other systems reviewed and are negative.   There were no vitals taken for this visit. Physical Exam  Constitutional: She is oriented to person, place, and time. She appears well-developed and well-nourished.  HENT:  Head: Atraumatic.  Eyes: EOM are normal.  Neck: Neck supple.  Cardiovascular: Regular rhythm and intact distal pulses.  GI: Soft.  Genitourinary: Vagina normal and uterus normal.  Genitourinary Comments: Nl adnexa Nl cervix. Uterus nl  Musculoskeletal: She exhibits no edema.  Neurological: She is alert and oriented to person, place, and time.  Skin: Skin is warm and dry.  Psychiatric: She has a normal mood and affect.    No results found for this or any previous visit (from the past 24  hour(s)).  No results found.  Assessment/Plan: Desires sterilization P) LTL with cautery. Risk of surgery reviewed including infection, bleeding, injury to underlying organ structure, failure rate 1/500-1/600, nonreversible , permanent sterilization. All ? answered Stacy Walker 11/04/2017, 5:33 AM

## 2017-11-04 NOTE — Anesthesia Procedure Notes (Signed)
Procedure Name: Intubation Date/Time: 11/04/2017 2:21 PM Performed by: Hewitt Blade, CRNA Pre-anesthesia Checklist: Patient identified, Emergency Drugs available, Suction available, Patient being monitored and Timeout performed Patient Re-evaluated:Patient Re-evaluated prior to induction Oxygen Delivery Method: Circle system utilized Preoxygenation: Pre-oxygenation with 100% oxygen Induction Type: IV induction Ventilation: Mask ventilation without difficulty Laryngoscope Size: Mac and 3 Grade View: Grade I Tube type: Oral Tube size: 7.0 mm Number of attempts: 1 Placement Confirmation: ETT inserted through vocal cords under direct vision,  positive ETCO2 and breath sounds checked- equal and bilateral Secured at: 21 cm Tube secured with: Tape Dental Injury: Teeth and Oropharynx as per pre-operative assessment

## 2017-11-04 NOTE — Transfer of Care (Signed)
Immediate Anesthesia Transfer of Care Note  Patient: Stacy Walker  Procedure(s) Performed: LAPAROSCOPIC TUBAL LIGATION WITH BIPOLAR CAUTERY (Bilateral Abdomen)  Patient Location: PACU  Anesthesia Type:General  Level of Consciousness: awake, alert  and oriented  Airway & Oxygen Therapy: Patient Spontanous Breathing and Patient connected to nasal cannula oxygen  Post-op Assessment: Report given to RN, Post -op Vital signs reviewed and stable and Patient moving all extremities  Post vital signs: Reviewed and stable  Last Vitals:  Vitals:   11/04/17 1143  BP: 104/70  Pulse: 64  Resp: 16  Temp: 36.5 C  SpO2: 100%    Last Pain:  Vitals:   11/04/17 1143  TempSrc: Oral      Patients Stated Pain Goal: 4 (11/04/17 1143)  Complications: No apparent anesthesia complications

## 2017-11-04 NOTE — Brief Op Note (Signed)
11/04/2017  3:35 PM  PATIENT:  Stacy Walker  36 y.o. female  PRE-OPERATIVE DIAGNOSIS:  Desires Sterilization  POST-OPERATIVE DIAGNOSIS:  Desires Sterilization  PROCEDURE:  Procedure(s): LAPAROSCOPIC TUBAL LIGATION WITH BIPOLAR CAUTERY (Bilateral)  SURGEON:  Surgeon(s) and Role:    * Aivy Akter, Nena JordanSheronette, MD - Primary  PHYSICIAN ASSISTANT:   ASSISTANTS: none   ANESTHESIA:   regional Findiings:  Nl tubes and ovaries, elongated appendix, nl uterus.no evidence of pelvic endometriosis In the ant or post cul de sac EBL:  5 mL   BLOOD ADMINISTERED:none  DRAINS: none   LOCAL MEDICATIONS USED:  MARCAINE     SPECIMEN:  No Specimen  DISPOSITION OF SPECIMEN:  N/A  COUNTS:  YES  TOURNIQUET:  * No tourniquets in log *  DICTATION: .Other Dictation: Dictation Number [id number]  PLAN OF CARE: Discharge to home after PACU  PATIENT DISPOSITION:  PACU - hemodynamically stable.   Delay start of Pharmacological VTE agent (>24hrs) due to surgical blood loss or risk of bleeding: no

## 2017-11-04 NOTE — Anesthesia Preprocedure Evaluation (Signed)
Anesthesia Evaluation  Patient identified by MRN, date of birth, ID band Patient awake    Reviewed: Allergy & Precautions, H&P , NPO status , Patient's Chart, lab work & pertinent test results  Airway Mallampati: II   Neck ROM: full    Dental   Pulmonary former smoker,    breath sounds clear to auscultation       Cardiovascular negative cardio ROS   Rhythm:regular Rate:Normal     Neuro/Psych    GI/Hepatic   Endo/Other    Renal/GU      Musculoskeletal   Abdominal   Peds  Hematology   Anesthesia Other Findings   Reproductive/Obstetrics                             Anesthesia Physical Anesthesia Plan  ASA: II  Anesthesia Plan: General   Post-op Pain Management:    Induction: Intravenous  PONV Risk Score and Plan: 3 and Ondansetron, Dexamethasone, Midazolam, Scopolamine patch - Pre-op and Treatment may vary due to age or medical condition  Airway Management Planned: Oral ETT  Additional Equipment:   Intra-op Plan:   Post-operative Plan: Extubation in OR  Informed Consent: I have reviewed the patients History and Physical, chart, labs and discussed the procedure including the risks, benefits and alternatives for the proposed anesthesia with the patient or authorized representative who has indicated his/her understanding and acceptance.     Plan Discussed with: CRNA, Anesthesiologist and Surgeon  Anesthesia Plan Comments:         Anesthesia Quick Evaluation

## 2017-11-04 NOTE — Discharge Instructions (Addendum)
Warm to abdomen every 4 hours x 24 hrs. Ambulate CALL  IF TEMP>100.4, NOTHING PER VAGINA X 2 WK, CALL IF SOAKING A MAXI  PAD EVERY HOUR OR MORE FREQUENTLY  DISCHARGE INSTRUCTIONS: Laparoscopy  The following instructions have been prepared to help you care for yourself upon your return home today.  Wound care:  Do not get the incision wet for the first 24 hours. The incision should be kept clean and dry.  The Band-Aids or dressings may be removed the day after surgery.  Should the incision become sore, red, and swollen after the first week, check with your doctor.  Personal hygiene:  Shower the day after your procedure.  Activity and limitations:  Do NOT drive or operate any equipment today.  Do NOT lift anything more than 15 pounds for 2-3 weeks after surgery.  Do NOT rest in bed all day.  Walking is encouraged. Walk each day, starting slowly with 5-minute walks 3 or 4 times a day. Slowly increase the length of your walks.  Walk up and down stairs slowly.  Do NOT do strenuous activities, such as golfing, playing tennis, bowling, running, biking, weight lifting, gardening, mowing, or vacuuming for 2-4 weeks. Ask your doctor when it is okay to start.  Diet: Eat a light meal as desired this evening. You may resume your usual diet tomorrow.  Return to work: This is dependent on the type of work you do. For the most part you can return to a desk job within a week of surgery. If you are more active at work, please discuss this with your doctor.  What to expect after your surgery: You may have a slight burning sensation when you urinate on the first day. You may have a very small amount of blood in the urine. Expect to have a small amount of vaginal discharge/light bleeding for 1-2 weeks. It is not unusual to have abdominal soreness and bruising for up to 2 weeks. You may be tired and need more rest for about 1 week. You may experience shoulder pain for 24-72 hours. Lying flat in bed  may relieve it.  Call your doctor for any of the following:  Develop a fever of 100.4 or greater  Inability to urinate 6 hours after discharge from hospital  Severe pain not relieved by pain medications  Persistent of heavy bleeding at incision site  Redness or swelling around incision site after a week  Increasing nausea or vomiting  Patient Signature________________________________________ Nurse Signature_________________________________________   Post Anesthesia Home Care Instructions  NO IBUPROFEN PRODUCTS UNTIL: 9:15 PM TONIGHT  Activity: Get plenty of rest for the remainder of the day. A responsible individual must stay with you for 24 hours following the procedure.  For the next 24 hours, DO NOT: -Drive a car -Advertising copywriterperate machinery -Drink alcoholic beverages -Take any medication unless instructed by your physician -Make any legal decisions or sign important papers.  Meals: Start with liquid foods such as gelatin or soup. Progress to regular foods as tolerated. Avoid greasy, spicy, heavy foods. If nausea and/or vomiting occur, drink only clear liquids until the nausea and/or vomiting subsides. Call your physician if vomiting continues.  Special Instructions/Symptoms: Your throat may feel dry or sore from the anesthesia or the breathing tube placed in your throat during surgery. If this causes discomfort, gargle with warm salt water. The discomfort should disappear within 24 hours.  If you had a scopolamine patch placed behind your ear for the management of post- operative nausea and/or  vomiting:  1. The medication in the patch is effective for 72 hours, after which it should be removed.  Wrap patch in a tissue and discard in the trash. Wash hands thoroughly with soap and water. 2. You may remove the patch earlier than 72 hours if you experience unpleasant side effects which may include dry mouth, dizziness or visual disturbances. 3. Avoid touching the patch. Wash your  hands with soap and water after contact with the patch.

## 2017-11-05 NOTE — Anesthesia Postprocedure Evaluation (Signed)
Anesthesia Post Note  Patient: Stacy Walker  Procedure(s) Performed: LAPAROSCOPIC TUBAL LIGATION WITH BIPOLAR CAUTERY (Bilateral Abdomen)     Patient location during evaluation: PACU Anesthesia Type: General Level of consciousness: awake and alert Pain management: pain level controlled Vital Signs Assessment: post-procedure vital signs reviewed and stable Respiratory status: spontaneous breathing, nonlabored ventilation, respiratory function stable and patient connected to nasal cannula oxygen Cardiovascular status: blood pressure returned to baseline and stable Postop Assessment: no apparent nausea or vomiting Anesthetic complications: no    Last Vitals:  Vitals:   11/04/17 1700 11/04/17 1800  BP: 112/63 107/75  Pulse: 72   Resp: 15 20  Temp: 36.6 C 36.5 C  SpO2: 98% 98%    Last Pain:  Vitals:   11/04/17 1700  TempSrc:   PainSc: 0-No pain   Pain Goal: Patients Stated Pain Goal: 4 (11/04/17 1700)               Michele Judy S

## 2017-11-06 ENCOUNTER — Encounter (HOSPITAL_COMMUNITY): Payer: Self-pay | Admitting: Obstetrics and Gynecology

## 2017-11-07 NOTE — Op Note (Signed)
NAME:  Stacy Walker, Stacy Walker                  ACCOUNT NO.:  MEDICAL RECORD NO.:  [id number]  LOCATION:                                 FACILITY:  PHYSICIAN:  Maxie BetterSheronette Clydean Posas, M.D.DATE OF BIRTH:  [birth year]  DATE OF PROCEDURE:  11/04/2017 DATE OF DISCHARGE:                              OPERATIVE REPORT   PREOPERATIVE DIAGNOSIS:  Desires sterilization.  PROCEDURE:  Laparoscopic tubal ligation with bipolar cautery.  POSTOPERATIVE DIAGNOSIS:  Desires sterilization.  ANESTHESIA:  General.  SURGEON:  Maxie BetterSheronette Kable Haywood, M.D.  ASSISTANT:  None.  DESCRIPTION OF PROCEDURE:  The patient was placed under general anesthesia and in the dorsal lithotomy position.  She was sterilely prepped and draped in usual fashion.  A bivalve speculum was then placed in the vagina.  Single-tooth tenaculum was placed on the anterior lip of the cervix.  An Acorn cannula was introduced into the uterine cavity and attached to the tenaculum for manipulation of the uterus.  The bivalve speculum was then removed.  The bladder had been catheterized for moderate amount of urine.  Attention was then turned to the abdomen, where an 0.25% Marcaine was injected infraumbilically and an infraumbilical incision was then carefully made.  Veress needle was introduced and tested, 2.5 L of CO2 was insufflated after opening pressure of 6 was noted.  The Veress needle was removed.  A 10 mm trocar with sleeve was introduced into the abdomen without incident.  The lighted videolaparoscope was then inserted.  The patient was subsequently placed into Trendelenburg position.  Panoramic inspection showed a normal liver edge.  The uterus was otherwise unremarkable.  A suprapubic incision was made after 0.25% Marcaine was injected and a 5 mm port was placed under direct visualization.  The pelvis was then inspected further.  No evidence of endometriosis in the anterior and posterior cul-de-sacs.  Normal tubes and ovaries were noted  bilaterally. Using the bipolar cautery, the midportion of both fallopian tubes was cauterized.  When that was felt to be adequate, the suprapubic port was removed.  The abdomen was deflated and the infraumbilical port was also removed.  The infraumbilical site was closed with 0 Vicryl figure-of- eight suture at the fascia and both incisions were then closed with 4-0 Vicryl subcuticular closure.  The instruments from the vagina were removed.  SPECIMEN:  None.  ESTIMATED BLOOD LOSS:  Minimal.  COMPLICATIONS:  None.  The patient tolerated the procedure well, was transferred to recovery in stable condition.     Maxie BetterSheronette Maxten Shuler, M.D.     [place]/[facility]  D:  11/04/2017  T:  11/05/2017  Job:  [id number]

## 2019-09-28 ENCOUNTER — Other Ambulatory Visit: Payer: Self-pay

## 2019-09-28 DIAGNOSIS — Z20822 Contact with and (suspected) exposure to covid-19: Secondary | ICD-10-CM

## 2019-09-29 LAB — NOVEL CORONAVIRUS, NAA: SARS-CoV-2, NAA: NOT DETECTED

## 2020-01-31 ENCOUNTER — Ambulatory Visit: Payer: BC Managed Care – PPO | Attending: Internal Medicine

## 2020-01-31 DIAGNOSIS — Z20822 Contact with and (suspected) exposure to covid-19: Secondary | ICD-10-CM

## 2020-02-01 ENCOUNTER — Telehealth: Payer: Self-pay | Admitting: *Deleted

## 2020-02-01 LAB — NOVEL CORONAVIRUS, NAA: SARS-CoV-2, NAA: NOT DETECTED

## 2020-02-01 NOTE — Telephone Encounter (Signed)
Called this pt with her child's covid results and she asked hers. Hers were not detected, however she now has symptoms, informed she could test again or assume she has it. Verbalized understanding.

## 2021-11-23 ENCOUNTER — Encounter (HOSPITAL_COMMUNITY): Payer: Self-pay

## 2021-11-23 ENCOUNTER — Ambulatory Visit (HOSPITAL_COMMUNITY)
Admission: EM | Admit: 2021-11-23 | Discharge: 2021-11-23 | Disposition: A | Discharge status: Home / Self Care | Payer: BC Managed Care – PPO | Attending: Family Medicine | Admitting: Family Medicine

## 2021-11-23 DIAGNOSIS — M79642 Pain in left hand: Secondary | ICD-10-CM

## 2021-11-23 DIAGNOSIS — R229 Localized swelling, mass and lump, unspecified: Secondary | ICD-10-CM

## 2021-11-23 MED ORDER — MUPIROCIN 2 % EX OINT: 1.0000 "application " | TOPICAL_OINTMENT | Freq: Two times a day (BID) | CUTANEOUS | 0 refills | Status: AC

## 2021-11-23 MED ORDER — LIDOCAINE-EPINEPHRINE 1 %-1:100000 IJ SOLN
INTRAMUSCULAR | Status: AC
  Filled 2021-11-23: qty 1

## 2021-11-23 NOTE — ED Triage Notes (Signed)
Pt has a wart to the left hand for greater than 1 month. Pt states she has attempted to use OTC medications to remove without success.

## 2021-11-23 NOTE — Discharge Instructions (Addendum)
If not allergic, you may use over the counter ibuprofen or acetaminophen as needed for pain. 

## 2021-11-25 LAB — SURGICAL PATHOLOGY

## 2021-11-25 NOTE — ED Provider Notes (Signed)
°  [facility]   [id number] 11/23/21 Arrival Time: 0830  ASSESSMENT & PLAN:  1. Left hand pain   2. Localized skin mass, lump, or swelling    Apply with bandage changes. Meds ordered this encounter  Medications   mupirocin ointment (BACTROBAN) 2 %    Sig: Apply 1 application topically 2 (two) times daily.    Dispense:  22 g    Refill:  0    Procedure: Verbal consent obtained. Patient provided with risks and alternatives to the procedure. Discussed possibility of this returning after removal. Area over skin mass of LEFT hand cleaned with betadine. Local anesthesia: Lidocaine 1% with epinephrine. Using sterile technique, #11 scalpel used to shave lesion. Moderate bleeding controlled with silver nitrate. Bandage applied. Before and after intact sensation of all fingers.  Removed specimen sent to surgical pathology.    Discharge Instructions      If not allergic, you may use over the counter ibuprofen or acetaminophen as needed for pain.     Plans to schedule f/u with dermatology.  Reviewed expectations re: course of current medical issues. Questions answered. Outlined signs and symptoms indicating need for more acute intervention. Patient verbalized understanding. After Visit Summary given.   SUBJECTIVE:  Stacy Walker is a 40 y.o. female who presents with a wart on L hand near 4th/5th MCP; x sev months. Irritated at times. Bleeds if she hits it; occasional. Would like removed.   OBJECTIVE:  Vitals:   11/23/21 0905  BP: 105/68  Pulse: 76  Resp: 16  Temp: 98.6 F (37 C)  TempSrc: Oral  SpO2: 100%     General appearance: alert; no distress Skin: approx 1 cm round irreg mass between 4th and 5th MCP; FROM of all fingers; normal distal sensation; mass without bleeding; no signs of infection Psychological: alert and cooperative; normal mood and affect    Labs Reviewed  SURGICAL PATHOLOGY    No results found.  No Known Allergies  Past  Medical History:  Diagnosis Date   Abnormal Pap smear 2008   LEEP, repeat WNL   Anemia    during pregnancy   Bacterial vaginosis    Postpartum care following vaginal delivery (9/23) 08/28/2014   Trichomonas    07/24/2013   Vaginal Pap smear, abnormal    normal since LEEP   Social History   Socioeconomic History   Marital status: Married    Spouse name: Not on file   Number of children: Not on file   Years of education: Not on file   Highest education level: Not on file  Occupational History   Not on file  Tobacco Use   Smoking status: Former   Smokeless tobacco: Never   Tobacco comments:    occ smoker in HS, not sice  Vaping Use   Vaping Use: Never used  Substance and Sexual Activity   Alcohol use: No   Drug use: No   Sexual activity: Yes    Birth control/protection: None  Other Topics Concern   Not on file  Social History Narrative   Not on file   Social Determinants of Health   Financial Resource Strain: Not on file  Food Insecurity: Not on file  Transportation Needs: Not on file  Physical Activity: Not on file  Stress: Not on file  Social Connections: Not on file          Mardella Layman, MD 11/25/21 1052

## 2023-01-02 ENCOUNTER — Ambulatory Visit (HOSPITAL_COMMUNITY)
Admission: EM | Admit: 2023-01-02 | Discharge: 2023-01-02 | Disposition: A | Discharge status: Home / Self Care | Payer: BC Managed Care – PPO | Attending: Physician Assistant | Admitting: Physician Assistant

## 2023-01-02 ENCOUNTER — Encounter (HOSPITAL_COMMUNITY): Payer: Self-pay

## 2023-01-02 ENCOUNTER — Ambulatory Visit (INDEPENDENT_AMBULATORY_CARE_PROVIDER_SITE_OTHER): Payer: BC Managed Care – PPO

## 2023-01-02 DIAGNOSIS — M7521 Bicipital tendinitis, right shoulder: Secondary | ICD-10-CM | POA: Diagnosis not present

## 2023-01-02 DIAGNOSIS — M25511 Pain in right shoulder: Secondary | ICD-10-CM

## 2023-01-02 MED ORDER — DICLOFENAC SODIUM 75 MG PO TBEC: 75.0000 mg | DELAYED_RELEASE_TABLET | Freq: Two times a day (BID) | ORAL | 0 refills | Status: AC

## 2023-01-02 NOTE — ED Triage Notes (Signed)
Pt states that she was at work and was putting a shoe box on the shelf and arm went limp. Right shoulder pain x1 week. Took ibu. No relief. Icy hot patches with no relief. Took excedrine this morning.

## 2023-01-02 NOTE — ED Provider Notes (Signed)
[facility]    CSN: [id number] Arrival date & time: 01/02/23  1001      History   Chief Complaint Chief Complaint  Patient presents with   Shoulder Pain    HPI Stacy Walker is a 42 y.o. female.   Patient presents today with a 1 week history of right shoulder pain.  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  This has been ongoing since that time.  Denies additional injury or change in activity prior to symptom onset.  She reports minimal pain at rest but has excruciating pain when she tries to lift her hand above her head which is rated 6/7 on a 0-10 pain scale.  She has tried over-the-counter medications including ibuprofen, Excedrin, IcyHot patches without improvement of symptoms.  She is right-handed.  Denies any numbness or paresthesias in the hand.  Reports decreased range of motion that is impacting her ability perform daily activities.  Denies previous injury or surgery involving shoulder.    Past Medical History:  Diagnosis Date   Abnormal Pap smear 2008   LEEP, repeat WNL   Anemia    during pregnancy   Bacterial vaginosis    Postpartum care following vaginal delivery (9/23) 08/28/2014   Trichomonas    07/24/2013   Vaginal Pap smear, abnormal    normal since LEEP    Patient Active Problem List   Diagnosis Date Noted   Active labor at term 08/28/2014   Postpartum care following vaginal delivery (9/23) 08/28/2014    Past Surgical History:  Procedure Laterality Date   LAPAROSCOPIC TUBAL LIGATION Bilateral 11/04/2017   Procedure: LAPAROSCOPIC TUBAL LIGATION WITH BIPOLAR CAUTERY;  Surgeon: Maxie Better, MD;  Location: [facility];  Service: Gynecology;  Laterality: Bilateral;   LEEP     WISDOM TOOTH EXTRACTION     X 2 teeth    OB History     Gravida  3   Para  2   Term  2   Preterm  0   AB  1   Living  2      SAB  1   IAB  0   Ectopic  0    Multiple  0   Live Births  2            Home Medications    Prior to Admission medications   Medication Sig Start Date End Date Taking? Authorizing Provider  aspirin-acetaminophen-caffeine (EXCEDRIN MIGRAINE) [phone] MG tablet Take 1 tablet daily as needed by mouth for headache.   Yes [provider]  diclofenac (VOLTAREN) 75 MG EC tablet Take 1 tablet (75 mg total) by mouth 2 (two) times daily. 01/02/23  Yes Sanaa Zilberman, Denny Peon K, PA-C  mupirocin ointment (BACTROBAN) 2 % Apply 1 application topically 2 (two) times daily. 11/23/21  Yes Mardella Layman, MD    Family History Family History  Problem Relation Age of Onset   Other Neg Hx    Hearing loss Neg Hx     Social History Social History   Tobacco Use   Smoking status: Former   Smokeless tobacco: Never   Tobacco comments:    occ smoker in HS, not sice  Vaping Use   Vaping Use: Never used  Substance Use Topics   Alcohol use: No   Drug use: No     Allergies   Patient has no known allergies.   Review of Systems Review of  Systems  Constitutional:  Positive for activity change. Negative for appetite change, fatigue and fever.  Musculoskeletal:  Positive for arthralgias. Negative for joint swelling and myalgias.  Neurological:  Negative for weakness and numbness.     Physical Exam Triage Vital Signs ED Triage Vitals  Enc Vitals Group     BP 01/02/23 1019 104/72     Pulse Rate 01/02/23 1019 71     Resp 01/02/23 1019 18     Temp 01/02/23 1019 98.4 F (36.9 C)     Temp Source 01/02/23 1019 Oral     SpO2 01/02/23 1019 98 %     Weight 01/02/23 1017 140 lb (63.5 kg)     Height --      Head Circumference --      Peak Flow --      Pain Score 01/02/23 1016 7     Pain Loc --      Pain Edu? --      Excl. in [place]? --    No data found.  Updated Vital Signs BP 104/72 (BP Location: Right Arm)   Pulse 71   Temp 98.4 F (36.9 C) (Oral)   Resp 18   Wt 140 lb (63.5 kg)   LMP 12/27/2022 (Exact Date)    SpO2 98%   BMI 24.03 kg/m   Visual Acuity Right Eye Distance:   Left Eye Distance:   Bilateral Distance:    Right Eye Near:   Left Eye Near:    Bilateral Near:     Physical Exam Vitals reviewed.  Constitutional:      General: She is awake. She is not in acute distress.    Appearance: Normal appearance. She is well-developed. She is not ill-appearing.     Comments: Very pleasant female appears stated age no acute distress sitting comfortably in exam room  HENT:     Head: Normocephalic and atraumatic.  Cardiovascular:     Rate and Rhythm: Normal rate and regular rhythm.     Heart sounds: Normal heart sounds, S1 normal and S2 normal. No murmur heard. Pulmonary:     Effort: Pulmonary effort is normal.     Breath sounds: Normal breath sounds. No wheezing, rhonchi or rales.     Comments: Clear to auscultation bilaterally Abdominal:     Palpations: Abdomen is soft.     Tenderness: There is no abdominal tenderness.  Musculoskeletal:     Right shoulder: Tenderness present. No swelling, deformity or bony tenderness. Decreased range of motion.     Comments: Right shoulder: No swelling or deformity noted.  Tenderness palpation over AC joint.  Decreased forward flexion and overhead flexion.  Normal extension and abduction/adduction.  Strength 5/5 bilateral upper extremities.  Hands neurovascularly intact.  Negative drop arm.  Psychiatric:        Behavior: Behavior is cooperative.      UC Treatments / Results  Labs (all labs ordered are listed, but only abnormal results are displayed) Labs Reviewed - No data to display  EKG   Radiology DG Shoulder Right  Result Date: 01/02/2023 CLINICAL DATA:  Pain at [facility] joint after lifting something. EXAM: RIGHT SHOULDER - 2+ VIEW COMPARISON:  None Available. FINDINGS: There is no evidence of fracture or dislocation. There is no evidence of arthropathy or other focal bone abnormality. Soft tissues are unremarkable. IMPRESSION: Negative.  Electronically Signed   By: Dorise Bullion III M.D.   On: 01/02/2023 11:25    Procedures Procedures (including critical care time)  Medications  Ordered in UC Medications - No data to display  Initial Impression / Assessment and Plan / UC Course  I have reviewed the triage vital signs and the nursing notes.  Pertinent labs & imaging results that were available during my care of the patient were reviewed by me and considered in my medical decision making (see chart for details).     X-ray was obtained that was normal in clinic today.  Suspect bicep tendinitis as etiology of symptoms.  She was instructed to use diclofenac for pain with instruction to take additional NSAIDs with this medication due to risk of GI bleeding.  She can use acetaminophen/Tylenol for additional pain relief.  Recommended heat, rest, stretch for additional symptom relief.  She was encouraged to avoid strenuous activity including heavy lifting.  Recommended that she follow-up with sports medicine if her symptoms are improving quickly was given contact information for local provider with instruction call to schedule an appointment.  If she has any worsening or changing symptoms including increasing pain, decreased range of motion, numbness/paresthesias in the hand she needs to be reevaluated immediately.  Strict return precautions given.  Work excuse note provided.  Final Clinical Impressions(s) / UC Diagnoses   Final diagnoses:  Biceps tendinitis of right upper extremity     Discharge Instructions      X-ray was normal.  I am concerned about inflammation of your biceps tendon causing your pain.  Try to avoid overhead movement or anything that exacerbates the pain.  Use warm compress on this area.  Take diclofenac twice daily.  You should not take additional NSAIDs with this medication including aspirin, ibuprofen/Advil, naproxen/Aleve.  You can use acetaminophen/Tylenol for breakthrough pain.  I do recommend that you  follow-up with a sports medicine provider for further evaluation and management as you may benefit from physical therapy which they can help arrange.  If you have any worsening or changing symptoms you need to be seen immediately.     ED Prescriptions     Medication Sig Dispense Auth. Provider   diclofenac (VOLTAREN) 75 MG EC tablet Take 1 tablet (75 mg total) by mouth 2 (two) times daily. 30 tablet Ferrel Simington, Derry Skill, PA-C      PDMP not reviewed this encounter.   Terrilee Croak, PA-C 01/02/23 1138

## 2023-01-02 NOTE — Discharge Instructions (Signed)
X-ray was normal.  I am concerned about inflammation of your biceps tendon causing your pain.  Try to avoid overhead movement or anything that exacerbates the pain.  Use warm compress on this area.  Take diclofenac twice daily.  You should not take additional NSAIDs with this medication including aspirin, ibuprofen/Advil, naproxen/Aleve.  You can use acetaminophen/Tylenol for breakthrough pain.  I do recommend that you follow-up with a sports medicine provider for further evaluation and management as you may benefit from physical therapy which they can help arrange.  If you have any worsening or changing symptoms you need to be seen immediately.
